# Patient Record
Sex: Female | Born: 1975 | Race: Black or African American | Hispanic: No | Marital: Married | Smoking: Never smoker
Health system: Southern US, Community
[De-identification: ages and names within clinical notes are randomized; demographics above are authoritative.]

## PROBLEM LIST (undated history)

## (undated) DIAGNOSIS — G5603 Carpal tunnel syndrome, bilateral upper limbs: Secondary | ICD-10-CM

## (undated) DIAGNOSIS — G500 Trigeminal neuralgia: Secondary | ICD-10-CM

## (undated) DIAGNOSIS — F419 Anxiety disorder, unspecified: Secondary | ICD-10-CM

## (undated) DIAGNOSIS — F32A Depression, unspecified: Secondary | ICD-10-CM

## (undated) DIAGNOSIS — E119 Type 2 diabetes mellitus without complications: Secondary | ICD-10-CM

## (undated) HISTORY — PX: TUBAL LIGATION: SHX77

## (undated) HISTORY — PX: WISDOM TOOTH EXTRACTION: SHX21

## (undated) HISTORY — DX: Trigeminal neuralgia: G50.0

---

## 2003-04-01 ENCOUNTER — Other Ambulatory Visit: Admission: RE | Admit: 2003-04-01 | Discharge: 2003-04-01 | Payer: Self-pay | Admitting: Obstetrics and Gynecology

## 2003-09-29 ENCOUNTER — Observation Stay (HOSPITAL_COMMUNITY): Admission: AD | Admit: 2003-09-29 | Discharge: 2003-09-30 | Payer: Self-pay | Admitting: Obstetrics and Gynecology

## 2003-10-24 ENCOUNTER — Inpatient Hospital Stay (HOSPITAL_COMMUNITY): Admission: AD | Admit: 2003-10-24 | Discharge: 2003-10-26 | Payer: Self-pay | Admitting: Obstetrics and Gynecology

## 2004-04-05 ENCOUNTER — Other Ambulatory Visit: Admission: RE | Admit: 2004-04-05 | Discharge: 2004-04-05 | Payer: Self-pay | Admitting: Obstetrics and Gynecology

## 2005-04-21 ENCOUNTER — Other Ambulatory Visit: Admission: RE | Admit: 2005-04-21 | Discharge: 2005-04-21 | Payer: Self-pay | Admitting: Obstetrics and Gynecology

## 2006-05-08 ENCOUNTER — Other Ambulatory Visit: Admission: RE | Admit: 2006-05-08 | Discharge: 2006-05-08 | Payer: Self-pay | Admitting: Obstetrics and Gynecology

## 2006-10-17 HISTORY — PX: WISDOM TOOTH EXTRACTION: SHX21

## 2006-10-17 HISTORY — PX: TUBAL LIGATION: SHX77

## 2007-08-18 ENCOUNTER — Inpatient Hospital Stay (HOSPITAL_COMMUNITY): Admission: AD | Admit: 2007-08-18 | Discharge: 2007-08-18 | Payer: Self-pay | Admitting: Obstetrics and Gynecology

## 2007-08-20 ENCOUNTER — Inpatient Hospital Stay (HOSPITAL_COMMUNITY): Admission: AD | Admit: 2007-08-20 | Discharge: 2007-08-22 | Payer: Self-pay | Admitting: Obstetrics and Gynecology

## 2007-09-27 ENCOUNTER — Ambulatory Visit (HOSPITAL_COMMUNITY): Admission: RE | Admit: 2007-09-27 | Discharge: 2007-09-27 | Payer: Self-pay | Admitting: Obstetrics and Gynecology

## 2007-10-18 DIAGNOSIS — E0590 Thyrotoxicosis, unspecified without thyrotoxic crisis or storm: Secondary | ICD-10-CM

## 2007-10-18 HISTORY — DX: Thyrotoxicosis, unspecified without thyrotoxic crisis or storm: E05.90

## 2008-05-21 ENCOUNTER — Encounter: Admission: RE | Admit: 2008-05-21 | Discharge: 2008-05-21 | Payer: Self-pay | Admitting: Endocrinology

## 2009-06-07 ENCOUNTER — Ambulatory Visit (HOSPITAL_COMMUNITY): Admission: RE | Admit: 2009-06-07 | Discharge: 2009-06-07 | Payer: Self-pay | Admitting: Emergency Medicine

## 2011-03-01 NOTE — Op Note (Signed)
NAMESHEMAIAH, ROUND   ACCOUNT NO.:  [id number]   MEDICAL RECORD NO.:  [id number]          PATIENT TYPE:  AMB   LOCATION:  SDC                           [facility]ILITY:  [facility]   PHYSICIAN:  Janine Limbo, M.D.DATE OF BIRTH:  1975-11-19   DATE OF PROCEDURE:  09/27/2007  DATE OF DISCHARGE:                               OPERATIVE REPORT   PREOPERATIVE DIAGNOSIS:  1. Fibroid uterus.  2. Obesity (weight 213 pounds).   POSTOPERATIVE DIAGNOSIS:  1. Fibroid uterus.  2. Obesity (weight 213 pounds).  3. Fibroid uterus.   PROCEDURE:  Laparoscopic tubal cautery.   SURGEON:  Dr. Leonard Schwartz.   FIRST ASSISTANT:  None.   ANESTHETIC:  General.   DISPOSITION:  Ms. Lynnette Caffey is a 35 year old female, para 2-0-0-2, who  presents with the above diagnosis.  She understands the indications for  her surgical procedure and she accepts the risks of, but not limited to,  anesthetic complications, bleeding, infection, possible damage to the  surrounding organs, and possible tubal failure.   FINDINGS:  The uterus was 12 weeks' size and it was irregular.  Multiple  subserosal fibroids were noted.  The fallopian tubes and the ovaries  were normal.  The bowel appeared normal.  The liver and the gallbladder  appeared normal.  The patient's preoperative hemoglobin was 12.7.  Her  hematocrit was 39.7%.  Her white blood cell count is 5300.  Her platelet  count was 276,000.  Urine pregnancy test was negative. Urinalysis was  negative.   DESCRIPTION OF PROCEDURE:  The patient was taken to the operating room  where a general anesthetic was given.  The patient's abdomen, perineum,  and vagina were prepped with multiple layers of Betadine.  The bladder  was drained of urine.  Examination under anesthesia was performed.  A  Hulka tenaculum was placed inside the uterus.  The patient was sterilely  draped.  The subumbilical area was injected with 5 mL of 0.5% Marcaine  with epinephrine.  An  incision was made in the subumbilical area and the  Veress needle was inserted into the abdominal cavity without difficulty.  Proper placement was confirmed using the saline drop test.  A  pneumoperitoneum was then obtained.  The laparoscopic trocar and then  the laparoscope were substituted for the Veress needle.  The pelvis was  visualized with findings as mentioned above.  The left fallopian tube  was identified and followed to its fimbriated end.  The proximal portion  of the left fallopian tube was cauterized in several areas using the  bipolar cautery. Hemostasis was adequate.  Care was taken not to damage  any of the surrounding organs.  An identical procedure was carried out  on the opposite side.  The pelvis was then carefully inspected. The  upper abdomen was inspected.  Pictures were taken. We felt we were ready  to end our procedure.  The pneumoperitoneum was allowed to escape.  A  final check was made for hemostasis and hemostasis was adequate.  Again  there was no evidence of damage to the bowel or other abdominal  structures.  The abdominal trocar was removed. The subumbilical fascia  was  closed using figure-of-eight sutures of #0 Vicryl.  The skin was  reapproximated using a subcuticular suture of 3-0 Monocryl.  The Hulka  tenaculum was removed.  The patient was noted to have brisk bleeding  from the cervix.  A speculum exam was performed and the patient was  noted to have brisk bleeding from the place where the Hulka tenaculum  attached to the cervix.  A figure-of-eight suture of #0 Vicryl was  placed and hemostasis was adequate.  The patient was then returned to  the supine position.  She was awakened from her anesthetic and taken to  the recovery room in stable condition.  She tolerated her procedure  well.   FOLLOW-UP INSTRUCTIONS:  The patient was given a prescription for Motrin  and she will take 800 mg every 8 hours as needed for mild to moderate  pain.  She will  take Percocet 5/325 1 or 2 tablets every 4 hours as  needed for severe pain.  She will return to see Dr. Stefano Gaul in 2-3  weeks for a follow-up examination.  She was given a copy of the  postoperative instruction sheet as prepared by the [facility] of  [facility] for patients who have undergone laparoscopy.      Janine Limbo, M.D.  Electronically Signed     AVS/MEDQ  D:  09/27/2007  T:  09/28/2007  Job:  [id number]

## 2011-03-01 NOTE — H&P (Signed)
[name], [name] NO.:  [id number]   MEDICAL RECORD NO.:  [id number]           PATIENT TYPE:   LOCATION:                                 FACILITY:   PHYSICIAN:  Janine Limbo, M.D.    DATE OF BIRTH:   DATE OF ADMISSION:  09/27/2007  DATE OF DISCHARGE:                              HISTORY & PHYSICAL   HISTORY OF PRESENT ILLNESS:  [name] is a 35 year old female,  para 2-0-0-2, who desires sterilization.  The patient has been followed  by the [facility] Gynecology Division of [facility].   OBSTETRICAL HISTORY:  1. The patient had a vaginal delivery in 2005 of a healthy female      infant.  2. She had a vaginal delivery on August 20, 2007, of a healthy female      infant.   DRUG ALLERGIES:  The patient is allergic to PENICILLIN.  She denies  Latex allergy and Betadine allergy.   PAST MEDICAL HISTORY:  The patient has a history of thalassemia trait.  She did have two teeth surgically removed in May 2008.   CURRENT MEDICATIONS:  Include vitamins.   SOCIAL HISTORY:  The patient denies cigarette use, alcohol use, and  recreational drug use.   REVIEW OF SYSTEMS:  Noncontributory.   FAMILY HISTORY:  Noncontributory.   PHYSICAL EXAMINATION:  VITAL SIGNS: Weight 213 pounds.  HEENT:  Within normal limits.  CHEST:  Clear.  HEART:  Regular rate and rhythm.  BREASTS:  Without masses.  ABDOMEN:  Nontender.  EXTREMITIES:  Grossly normal.  NEUROLOGIC:  Exam is grossly normal.  PELVIC:  External genitalia is normal.  Vagina is normal except for  relaxation.  Cervix is nontender.  Uterus is normal size, shape, and  consistency. Adnexa: No masses.   ASSESSMENT:  Desires permanent sterilization.   PLAN:  The patient will undergo a laparoscopic tubal cautery.  She  understands the indications for her surgical procedure, and she accepts  the risks of, but not limited to, anesthetic complications, bleeding,  infections,  possible damage to the surrounding organs, and possible  tubal failure (17 per 1000).      Janine Limbo, M.D.  Electronically Signed     AVS/MEDQ  D:  09/25/2007  T:  09/25/2007  Job:  [id number]
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
## 2011-03-04 NOTE — Discharge Summary (Signed)
NAME:  Kelly Charles, Kelly Charles             ACCOUNT NO.:  [id number]   MEDICAL RECORD NO.:  [id number]                   PATIENT TYPE:  OBV   LOCATION:  9165                                 [facility]ILITY:  [facility]   PHYSICIAN:  Osborn Coho, M.D.                DATE OF BIRTH:  February 10, 1976   DATE OF ADMISSION:  09/29/2003  DATE OF DISCHARGE:                                 DISCHARGE SUMMARY   ADMISSION DIAGNOSES:  1. Intrauterine pregnancy at 36 weeks.  2. Vaginal bleeding post exam.  3. Small mild variables on nonstress test at the office.   DISCHARGE DIAGNOSES:  1. Intrauterine pregnancy at 67 and one.  2. Resolved vaginal bleeding.  3. Reactive and reassuring fetal heart rate tracing.   PROCEDURES THIS ADMISSION:  None.   HOSPITAL COURSE:  Mrs. Hobdy is a 35 year old married black  female gravida 1 para 0 at 53 and zero-sevenths weeks who presented to  maternity admissions for evaluation secondary to having significant bright  red bleeding following collection of gonorrhea, chlamydia, and GBS cultures  at the office.  Because of her significant bleeding she was recommended to  have an NST at the office on which variables were present and she was then  sent over to maternity admissions for a CST and observation.  Her CST was  negative and her bleeding was minimal; however, the plan was made to observe  the patient overnight and to have an ultrasound for estimated fetal weight  and AFI on the morning of September 30, 2003.  She denies any regular uterine  contractions.  Her fetal heart rate is now reactive and reassuring without  variables noted, and the ultrasound is still pending, and cultures are also  pending.  She is deemed ready for discharge if the ultrasound is within  normal limits.   DISCHARGE INSTRUCTIONS:  Call for increased bleeding or signs/symptoms of  labor, decreased fetal movement.   DISCHARGE FOLLOW-UP:  As scheduled at [facility] or in  one week  if she is not currently scheduled.   DISCHARGE LABORATORY DATA:  Her cultures from yesterday are still pending.  Her wbc count on admission is 9.1, her hemoglobin is 9.9, and her platelets  are 260.   DISCHARGE MEDICATIONS:  Continue on prenatal vitamins daily.     Concha Pyo. Duplantis, C.N.M.              Osborn Coho, M.D.    SJD/MEDQ  D:  09/30/2003  T:  09/30/2003  Job:  [id number]

## 2011-03-04 NOTE — H&P (Signed)
NAME:  DARSHAY, DEUPREE             ACCOUNT NO.:  [id number]   MEDICAL RECORD NO.:  [id number]                   PATIENT TYPE:  OBV   LOCATION:  9165                                 [facility]  [facility]   PHYSICIAN:  Dr. Normand Sloop                         DATE OF BIRTH:  [birth year]   DATE OF ADMISSION:  09/29/2003  DATE OF DISCHARGE:                                HISTORY & PHYSICAL   [name] is a 35 year old married black female, primigravida, at 36-0/7  weeks who presents from the office after having cervical cultures collected,  followed by vaginal bleeding.  She then had a nonstress test in the office  that had fetal heart rate variables present. Her pregnancy has been followed  by [facility] OB/GYN MD service and has been unremarkable for: 1)  penicillin allergy; 2) history of thalassemia trait; 3) history of ulcer; 4)  obesity.  Her prenatal labs were collected on March 30, 2003, with hemoglobin  of 10.9, hematocrit 33.2, platelet count 345,000, blood type A positive,  antibody negative, sickle cell trait negative, RPR nonreactive, rubella  immune, hepatitis B surface antigen negative, HIV nonreactive, cystic  fibrosis testing negative. Her one hour Glucola on May 26, 2003, was 131.  Her repeat Glucola on August 18, 2003, was within normal limits and her  hemoglobin at that time was 10.4. Her group B strep, gonorrhea and chlamydia  cultures were collected today, September 29, 2003, and they are pending.   HISTORY OF PRESENT PREGNANCY:  She presented for care at [facility] on  April 01, 2003, at approximately 10 week's gestation. Pregnancy  ultrasonography at 18 week's gestation showed growth consistent with  previous dating, placenta was anterior. She was treated for bacterial  vaginosis at 18 week's gestation. The rest of her prenatal care was  unremarkable.   OBSTETRIC HISTORY:  She is a primigravida.   MEDICAL HISTORY:  She has a medication allergy to  PENICILLIN resulting in a  rash and throat swelling. She has used oral contraceptives in the past, but  she stopped in December 2003. She was treated for Trichomonads in 2003. She  has had a yeast infection times one. She has an Art therapist. She reports  having had the usual childhood illnesses. She was diagnosed with thalassemia  in 2004 and she takes iron for that.  She was diagnosed with peptic ulcer in  2004.   SURGICAL HISTORY:  Negative.   FAMILY MEDICAL HISTORY:  Multiple family members have history of MI as well  as hypertension.  Also multiple family members with diabetes, maternal aunt  with thyroid dysfunction. Maternal grandmother received dialysis and also  had a stroke. Mother has a history of migraines. Father had history of  prostate cancer. Maternal grandfather with bladder cancer. Maternal aunt  with depression, anxiety, and schizophrenia.   GENETIC HISTORY:  Remarkable for a patient's cousin with autism and patient  has also has  two cousins with sickle cell trait.   SOCIAL HISTORY:  The patient is married to the father of the baby.  He is  involved and supportive. His name is Samule Dry. He is educated through the 11th  and is self-employed. The patient is college educated and is employed full  time. They deny any alcohol, tobacco, or illicit drug use with the  pregnancy.   OBJECTIVE:  VITAL SIGNS: Her vital signs are stable and she is afebrile.  HEENT: Grossly within normal limits.  CHEST: Clear to auscultation.  HEART: Regular rate and rhythm.  ABDOMEN: Gravid in contour with fundal height staying approximately 36 cm  above the pubic symphysis. Fetal heart rate is reactive and reassuring. She  has a negative OCP.  She had one variable down the 120s times 5 seconds with  a quick recovery to the baseline of 140s.  Uterine contractions every two to  four minutes with a Pitocin at 2 milliunits per minute during her OCT.  Occasional uterine contractions without present.  Sterile speculum exam shows  white discharge present with small pink tinge. No active bleeding.  EXTREMITIES: Within normal limits.   ASSESSMENT:  1. Intrauterine pregnancy at 36 weeks.  2. Previous fetal heart rate variables.  3. Status post vaginal bleeding.   PLAN:  Admit for 23-hour observation for consult with Dr. Normand Sloop to observe  fetal heart rate as well as vaginal bleeding.     Cam Hai, C.N.M.                     Dr. Normand Sloop    KS/MEDQ  D:  09/29/2003  T:  09/29/2003  Job:  [id number]

## 2011-03-04 NOTE — H&P (Signed)
NAME:  Kelly Charles, Kelly Charles             ACCOUNT NO.:  [id number]   MEDICAL RECORD NO.:  [id number]                   PATIENT TYPE:  INP   LOCATION:  9174                                 [facility]ILITY:  [facility]   PHYSICIAN:  Janine Limbo, M.D.            DATE OF BIRTH:  May 15, 1976   DATE OF ADMISSION:  10/24/2003  DATE OF DISCHARGE:                                HISTORY & PHYSICAL   HISTORY OF PRESENT ILLNESS:  Kelly Charles is a 35 year old gravida  1, para 0 at 39-4/7 weeks, who presented from the office with uterine  contractions every five minutes, and cervix 4-5 cm, 90% vertex at a 0  station with a bulging bag of water.  The patient is requesting an epidural  at present.  She reports positive fetal movement.  Denies leaking or  bleeding, headache, visual symptoms or epigastric pain.   PRESENT PREGNANCY RISKS:  Pregnancy has been remarkable for:  1. Positive group B strep, with allergy to penicillin and with group B     sensitivity to clindamycin documented.  2. Thalassemia trait, with father of the baby negative.  3. History of ulcer.   PRENATAL LABS:  Blood type A positive.  Rh antibody negative.  VDRL  nonreactive.  Rubella titer positive.  Hepatitis B surface antigen negative.  HIV nonreactive.  GC and chlamydia cultures negative.  Pap normal.  Glucose  challenge normal.  AFP quadruple screen declined.  Hemoglobin  electrophoresis negative.  Hemoglobin upon entry into practice 10.9, it was  10.4 at 30 weeks.  Group B strep culture was positive at 36 weeks.  Other  cultures were negative.  Cystic fibrosis testing was also negative.   HISTORY OF PRESENT PREGNANCY:  [facility] October 27, 2003 was established by last  menstrual period, and was in agreement with ultrasound at approximately 18  weeks.  The patient entered care at approximately 10 weeks.  She had  toxoplasmosis  titers done which were also negative, secondary to history of  indoor cat.  Husband was  referred to [facility] for evaluation of  thalassemia trait, this was negative.  The patient was started on iron at 18  weeks, secondary to hemoglobin of 10.0.  She had an early Glucola secondary  to weight above 200.  Quadruple screen was delayed.  Her follow-up Glucola  was normal.  She had negative cultures done at 36 weeks, but a positive  Group B strep was noted.  Due to her penicillin allergies, sensitivities  were done;  it was sensitive to clindamycin.  The rest of her pregnancy was  essentially uncomplicated.   OBSTETRICAL HISTORY:  The patient is a primigravida.   PAST MEDICAL HISTORY:  She was on oral contraceptives until December 2003.  She had trichomoniasis diagnosed in 2003.  She had a yeast infection in the  past.  She has an indoor cat.  She reports the usual childhood illnesses.  She was noted to be anemic in 2004, and was already  on iron.  She had peptic  ulcer diagnosed in 2004.  She had a history of thalassemia trait, but  negative hemoglobin electrophoresis this pregnancy.   ALLERGIES:  PENICILLIN (which causes rash and throat tightening).   FAMILY HISTORY:  Maternal grandfather had an MI.  Maternal grandmother had  an MI x2, and a maternal uncle had an MI.  Maternal aunt, maternal  grandmother and paternal grandmother have hypertension.  Mother, maternal  aunt, father and paternal grandmother all have diabetes.  Maternal aunt and  maternal uncle have noninsulin-dependent diabetes.  Paternal aunt has  thyroid issues.  Maternal grandmother had dialysis.  Maternal grandmother  had a stroke.  Mother had migraines.  Father is deceased from prostate  cancer.  Maternal grandfather had bladder cancer.  There is a history of  maternal aunts with depression, anxiety and schizophrenia.  The patient's  mother, maternal grandfather, maternal grandmother and maternal aunts all  smoke.   GENETIC HISTORY:  Remarkable for the patient's cousin having autism.  The   patient's cousin has sickle cell trait.   SOCIAL HISTORY:  The patient is married to the father of the baby; he is  involved and supportive Kelly Charles).  The patient is college educated.  She is employed as a Production designer, theatre/television/film.  Her husband has an 11th grade education and  he is self-employed.  The patient has been followed by the physician's  service at [facility].  She denies any alcohol, drug or tobacco  use during this pregnancy.  She is African-American in ethnicity.   PHYSICAL EXAMINATION:  VITAL SIGNS:  Stable.  The patient is afebrile.  HEENT:  Within normal limits.  LUNGS:  Bilateral breath sounds are clear.  HEART:  Regular rate and rhythm; without murmurs.  BREASTS:  Soft and nontender.  ABDOMEN:  Fundal height is approximately 39 cm.  Estimated fetal weight 7-  7.5 pounds.  Uterine contractions every five minutes with irritability  between and of moderate quality.  Fetal heart rate is reactive, with  occasional mild variables noted.  CERVICAL:  (Per Dr. Estanislado Pandy in the office)  4-5 cm, 90%, vertex at a 0  station; with bulging bag of waters.  EXTREMITIES:  Deep tendon reflexes are 2+ without clonus.  There is a trace  of edema noted.   IMPRESSION:  1. Intrauterine pregnancy at 39-4/7 weeks.  2. Active labor.  3. Positive group B strep, with sensitivity to clindamycin.  4. Penicillin allergy.   PLAN:  1. Admit to birthing suite for consult with Dr. Janine Limbo as     attending physician.  2. Routine physician orders.  3. Plan group B strep prophylaxis with clindamycin, secondary to penicillin     allergy.  4. Patient desires epidural.     Chip Boer L. Emilee Hero, C.N.M.                   Janine Limbo, M.D.    VLL/MEDQ  D:  10/24/2003  T:  10/24/2003  Job:  [id number]

## 2011-07-25 LAB — URINALYSIS, DIPSTICK ONLY
Bilirubin Urine: NEGATIVE
Glucose, UA: NEGATIVE
Hgb urine dipstick: NEGATIVE
Ketones, ur: NEGATIVE
Leukocytes, UA: NEGATIVE
Nitrite: NEGATIVE
Protein, ur: NEGATIVE
Specific Gravity, Urine: 1.015
Urobilinogen, UA: 0.2
pH: 6

## 2011-07-25 LAB — CBC
HCT: 39.7
Hemoglobin: 12.7
MCHC: 31.9
MCV: 73.9 — ABNORMAL LOW
Platelets: 276
RBC: 5.37 — ABNORMAL HIGH
RDW: 14
WBC: 5.3

## 2011-07-25 LAB — PREGNANCY, URINE: Preg Test, Ur: NEGATIVE

## 2011-07-26 LAB — CBC
HCT: 35.4 — ABNORMAL LOW
HCT: 37.8
Hemoglobin: 11.2 — ABNORMAL LOW
Hemoglobin: 12
MCHC: 31.5
MCHC: 31.6
MCV: 75.1 — ABNORMAL LOW
MCV: 75.8 — ABNORMAL LOW
Platelets: 269
Platelets: 270
RBC: 4.71
RBC: 4.99
RDW: 15.1 — ABNORMAL HIGH
RDW: 15.2 — ABNORMAL HIGH
WBC: 10.7 — ABNORMAL HIGH
WBC: 9.7

## 2011-07-26 LAB — RPR: RPR Ser Ql: NONREACTIVE

## 2011-07-26 LAB — KLEIHAUER-BETKE STAIN
Fetal Cells %: 0
Quantitation Fetal Hemoglobin: 0

## 2012-02-24 ENCOUNTER — Ambulatory Visit (INDEPENDENT_AMBULATORY_CARE_PROVIDER_SITE_OTHER): Payer: 59 | Admitting: Obstetrics and Gynecology

## 2012-02-24 ENCOUNTER — Encounter: Payer: Self-pay | Admitting: Obstetrics and Gynecology

## 2012-02-24 VITALS — BP 118/70 | Temp 98.1°F | Ht 66.0 in | Wt 223.0 lb

## 2012-02-24 DIAGNOSIS — B9689 Other specified bacterial agents as the cause of diseases classified elsewhere: Secondary | ICD-10-CM

## 2012-02-24 DIAGNOSIS — G500 Trigeminal neuralgia: Secondary | ICD-10-CM

## 2012-02-24 DIAGNOSIS — Z01419 Encounter for gynecological examination (general) (routine) without abnormal findings: Secondary | ICD-10-CM

## 2012-02-24 DIAGNOSIS — Z124 Encounter for screening for malignant neoplasm of cervix: Secondary | ICD-10-CM

## 2012-02-24 DIAGNOSIS — A499 Bacterial infection, unspecified: Secondary | ICD-10-CM

## 2012-02-24 DIAGNOSIS — N760 Acute vaginitis: Secondary | ICD-10-CM

## 2012-02-24 MED ORDER — METRONIDAZOLE 500 MG PO TABS: 500.0000 mg | ORAL_TABLET | Freq: Three times a day (TID) | ORAL | Status: AC

## 2012-02-24 MED ORDER — FLUCONAZOLE 150 MG PO TABS: 150.0000 mg | ORAL_TABLET | Freq: Once | ORAL | Status: AC

## 2012-02-24 NOTE — Progress Notes (Signed)
Subjective:    Kelly Charles is a 36 y.o. female, G2P2002, who presents for an annual exam. Patient is new to the practice and complains of malodorous vaginal discharge for the past 2 weeks.    History   Social History  . Marital Status: Married    Spouse Name: N/A    Number of Children: N/A  . Years of Education: N/A   Social History Main Topics  . Smoking status: Never Smoker   . Smokeless tobacco: Never Used  . Alcohol Use: No  . Drug Use: No  . Sexually Active: Yes -- Female partner(s)    Birth Control/ Protection: Other-see comments     BTL   Other Topics Concern  . None   Social History Narrative  . None    Menstrual cycle:   LMP: Patient's last menstrual period was 02/12/2012.  Flow medium with minimal cramps per patient           Review of Systems Pertinent items are noted in HPI. Breast:Negative for breast lump,nipple discharge or nipple retraction Gastrointestinal: Negative for abdominal pain, change in bowel habits or rectal bleeding Genitourinary: + vaginitis sx but no urgency, frequency or incontinence   Objective:    BP 118/70  Temp(Src) 98.1 F (36.7 C) (Oral)  Ht 5\' 6"  (1.676 m)  Wt 223 lb (101.152 kg)  BMI 35.99 kg/m2  LMP 02/12/2012   Wt Readings from Last 1 Encounters:  02/24/12 223 lb (101.152 kg)   Body mass index is 35.99 kg/(m^2).  General Appearance: Alert, appropriate appearance for age. No acute distress HEENT: Grossly normal Neck / Thyroid: Supple, no masses, nodes or enlargement Lungs: clear to auscultation bilaterally Back: No CVA tenderness Breast Exam:No masses or nodes.No dimpling, nipple retraction or discharge Cardiovascular: Regular rate and rhythm. S1, S2, no murmur Gastrointestinal: Soft, non-tender, no masses or organomegaly Pelvic Exam: EGBUS-wnl, vagina-moderate malodorous discharge, cervix-no lesions or tenderness, uterus-ULNS without tenderness, adnexae-no masses/tenderness Lymphatic Exam: Non-palpable  nodes in neck, clavicular, axillary, or inguinal regions  Skin: no rash or abnormalities Neurologic: Normal gait and speech, no tremor  Psychiatric: Alert and oriented, appropriate affect.   Wet Prep: pH 5.5, whiff +, clue +   Assessment:   Routine GYN Exam Bacterial Vaginosis   Plan:   Diflucan 150 mg #1 1 po stat 1 refill-yeast prophalaxis  Metronidazole 500 mg #14 bid x 7 days no refills; Etoh pre  cautions  PAP-done  RTO-1 year       Anyelina Claycomb PA-C

## 2012-02-24 NOTE — Patient Instructions (Signed)
Avoid: - excess soap on genital area (consider using plain oatmeal soap) - use of powder or sprays in genital area - douching - wearing underwear to bed (except with menses) - using more than is directed detergent when washing clothes - tight fitting garments around genital area - excess sugar intake   

## 2012-02-24 NOTE — Progress Notes (Signed)
Regular Periods: yes Mammogram: no  Monthly Breast Ex.: yes Exercise: no  Tetanus < 10 years: yes Seatbelts: yes  NI. Bladder Functn.: yes Abuse at home: no  Daily BM's: yes Stressful Work: yes  Healthy Diet: yes Sigmoid-Colonoscopy: NO  Calcium: no Medical problems this year: C/O VAGINAL DISCHARGE    LAST PAP:2011 NL  Contraception: BTL  Mammogram:  NEVER  PCP: DR. [name]  PMH: NO CHANGE  FMH: NOCHANGE  Last Bone Scan: NEVER

## 2012-02-28 LAB — PAP IG W/ RFLX HPV ASCU

## 2013-01-29 ENCOUNTER — Telehealth: Payer: Self-pay | Admitting: *Deleted

## 2013-01-29 NOTE — Telephone Encounter (Signed)
Patient calling to make a follow up appointment. Patient missed appointment this past November.

## 2013-04-17 ENCOUNTER — Ambulatory Visit (INDEPENDENT_AMBULATORY_CARE_PROVIDER_SITE_OTHER): Payer: 59 | Admitting: Nurse Practitioner

## 2013-04-17 ENCOUNTER — Encounter: Payer: Self-pay | Admitting: Nurse Practitioner

## 2013-04-17 VITALS — BP 110/64 | HR 78 | Ht 66.0 in | Wt 192.0 lb

## 2013-04-17 DIAGNOSIS — G500 Trigeminal neuralgia: Secondary | ICD-10-CM

## 2013-04-17 MED ORDER — CARBAMAZEPINE ER 300 MG PO CP12: 300.0000 mg | ORAL_CAPSULE | Freq: Two times a day (BID) | ORAL | Status: DC

## 2013-04-17 NOTE — Progress Notes (Signed)
HPI: Patient returns for followup after last visit 09/13/2011. She is a 37 year old black female who developed facial pain in late July 2010,  involving  the right side of the face, sometimes the upper jaw and sometimes the lower jaw. When it began, she noticed discomfort with talking and with biting/chewing  food on the right.   She went to Urgent care and was given a 12 day Prednisone dose pack. This did not help much. She describes the pain as consistent and throbbing, it does not let up. She has taken Neurontin 600 mg 6 to 7  times daily  with out improvement.   She typically has episodes once a month. She denies any other focal neurologic symptoms of numbness, weakness, paresthesia, visual symptoms, dysarthria, dysphagia, bowel or bladder symptoms. MRI of the brain was normal in the past.Carbitrol was added in January 2012 and she has had good control of neuralgia pain.  TODAY: Facial pain well controlled with Carbitrol.  She has discontinued Gabapentin. She  has not had a flare of facial  pain since last seen. She is pleased with how well she is doing. No new neurological complaints. See ROS     ROS:  negative  Physical Exam General: well developed, obese female , seated, in no evident distress Head: head normocephalic and atraumatic. Oropharynx benign Neck: supple with no carotid  bruits  Neurologic Exam Mental Status: Awake and fully alert. Oriented to place and time. Follows all commands. Speech and language normal.   Cranial Nerves:  Pupils equal, briskly reactive to light. Extraocular movements full without nystagmus. Visual fields full to confrontation. Hearing intact and symmetric to finger snap. Facial sensation intact. Face, tongue, palate move normally and symmetrically. Neck flexion and extension normal.  Motor: Normal bulk and tone. Normal strength in all tested extremity muscles.No focal weakness Sensory.: intact to touch and pinprick and vibratory.  Coordination: Rapid  alternating movements normal in all extremities. Finger-to-nose and heel-to-shin performed accurately bilaterally. Gait and Station: Arises from chair without difficulty. Stance is normal. Gait demonstrates normal stride length and balance . Able to heel, toe and tandem walk without difficulty.  Reflexes: 2+ and symmetric. Toes downgoing.     ASSESSMENT: Trigeminal neuralgia well controlled on generic Carbatrol     PLAN: Continue gen Carbitrol twice a day for facial pain Given refill for next year Followup in one year and when necessary Nilda Riggs, GNP-BC APRN

## 2013-04-17 NOTE — Patient Instructions (Addendum)
Continue carbamazepine twice a day for facial pain Given refill for next year Followup in one year and when necessary

## 2013-04-17 NOTE — Progress Notes (Signed)
I have read the note, and I agree with the clinical assessment and plan.  

## 2013-08-22 ENCOUNTER — Other Ambulatory Visit: Payer: Self-pay

## 2013-12-12 ENCOUNTER — Encounter (HOSPITAL_COMMUNITY): Payer: Self-pay | Admitting: Emergency Medicine

## 2013-12-12 ENCOUNTER — Emergency Department (HOSPITAL_COMMUNITY)
Admission: EM | Admit: 2013-12-12 | Discharge: 2013-12-12 | Disposition: A | Discharge status: Home / Self Care | Payer: 59 | Source: Home / Self Care | Attending: Emergency Medicine | Admitting: Emergency Medicine

## 2013-12-12 DIAGNOSIS — H109 Unspecified conjunctivitis: Secondary | ICD-10-CM

## 2013-12-12 HISTORY — DX: Type 2 diabetes mellitus without complications: E11.9

## 2013-12-12 MED ORDER — TOBRAMYCIN 0.3 % OP SOLN: 2.0000 [drp] | OPHTHALMIC | Status: DC

## 2013-12-12 NOTE — ED Notes (Signed)
Reports general aches, eyes red and swollen, eyes watery, onset last night, does not wear contacts

## 2013-12-12 NOTE — ED Provider Notes (Signed)
CSN: [id number]     Arrival date & time 12/12/13  1006 History   First MD Initiated Contact with Patient 12/12/13 1042     Chief Complaint  Patient presents with  . Generalized Body Aches  . Eye Pain     (Consider location/radiation/quality/duration/timing/severity/associated sxs/prior Treatment) HPI Comments: Began with right eye redness, discomfort, upper lid swelling and yellow discharge yesterday. Woke overnight to find that left eye had become involved with same. This morning has generalized myalgias and malaise. No vision changes. No URI sx. No contact lens use. No foreign body sensation.   Patient is a 38 y.o. female presenting with eye pain. The history is provided by the patient.  Eye Pain    Past Medical History  Diagnosis Date  . Trigeminal neuralgia   . Trigeminal neuralgia   . Diabetes mellitus without complication    Past Surgical History  Procedure Laterality Date  . Tubal ligation    . Wisdom tooth extraction     Family History  Problem Relation Age of Onset  . Cancer Maternal Grandfather     BLADDER  . Cancer Father     PROSTATE  . Diabetes Father   . Diabetes Mother   . Mental illness Mother     ANXIETY  . Aneurysm Mother     Small cerebral aneurysm  . Thyroid disease Maternal Aunt   . Cancer Maternal Aunt     LUNG  . Hypertension Maternal Uncle   . Diabetes Maternal Uncle   . Hypertension Maternal Aunt   . Diabetes Maternal Aunt   . Mental illness Maternal Aunt    History  Substance Use Topics  . Smoking status: Never Smoker   . Smokeless tobacco: Never Used  . Alcohol Use: No   OB History   Grav Para Term Preterm Abortions TAB SAB Ect Mult Living   2 2 2       2      Review of Systems  Eyes: Positive for pain.  All other systems reviewed and are negative.      Allergies  Penicillins  Home Medications   Current Outpatient Rx  Name  Route  Sig  Dispense  Refill  . carbamazepine (CARBATROL) 300 MG 12 hr capsule   Oral  Take 1 capsule (300 mg total) by mouth 2 (two) times daily.   180 capsule   3   . metFORMIN (GLUCOPHAGE-XR) 500 MG 24 hr tablet   Oral   Take 500 mg by mouth daily.         Marland Kitchen tobramycin (TOBREX) 0.3 % ophthalmic solution   Both Eyes   Place 2 drops into both eyes every 4 (four) hours. X 7 days   5 mL   0    BP 129/84  Pulse 94  Temp(Src) 100.2 F (37.9 C) (Oral)  Resp 16  SpO2 98%  LMP 12/11/2013 Physical Exam  Nursing note and vitals reviewed. Constitutional: She is oriented to person, place, and time. She appears well-developed and well-nourished. No distress.  HENT:  Head: Normocephalic and atraumatic.  Right Ear: External ear normal.  Left Ear: External ear normal.  Nose: Nose normal.  Eyes: EOM are normal. Pupils are equal, round, and reactive to light. Right eye exhibits discharge. Left eye exhibits discharge. Right conjunctiva is injected. Left conjunctiva is injected. No scleral icterus.  Bilateral upper eye lid edema (R>L) with mild erythema. Lashes matted with yellow crusted material. No photophobia or pain with EOMs  Neck: Normal range of  motion. Neck supple.  Cardiovascular: Normal rate, regular rhythm and normal heart sounds.   Pulmonary/Chest: Effort normal and breath sounds normal.  Musculoskeletal: Normal range of motion.  Lymphadenopathy:    She has no cervical adenopathy.  Neurological: She is alert and oriented to person, place, and time.  Skin: Skin is warm and dry.  Psychiatric: She has a normal mood and affect. Her behavior is normal.    ED Course  Procedures (including critical care time) Labs Review Labs Reviewed - No data to display Imaging Review No results found.    MDM   Final diagnoses:  Conjunctivitis  Bilateral Conjunctivitis: Will initiate treatment with Tobrex and advise close follow up if symptoms do not begin to improve over next 36-48 hours.     Jess BartersJennifer Lee [place], [place] 12/12/13 1101

## 2013-12-12 NOTE — Discharge Instructions (Signed)
Bacterial Conjunctivitis  Bacterial conjunctivitis, commonly called pink eye, is an inflammation of the clear membrane that covers the white part of the eye (conjunctiva). The inflammation can also happen on the underside of the eyelids. The blood vessels in the conjunctiva become inflamed causing the eye to become red or pink. Bacterial conjunctivitis may spread easily from one eye to another and from person to person (contagious).   CAUSES   Bacterial conjunctivitis is caused by bacteria. The bacteria may come from your own skin, your upper respiratory tract, or from someone else with bacterial conjunctivitis.  SYMPTOMS   The normally white color of the eye or the underside of the eyelid is usually pink or red. The pink eye is usually associated with irritation, tearing, and some sensitivity to light. Bacterial conjunctivitis is often associated with a thick, yellowish discharge from the eye. The discharge may turn into a crust on the eyelids overnight, which causes your eyelids to stick together. If a discharge is present, there may also be some blurred vision in the affected eye.  DIAGNOSIS   Bacterial conjunctivitis is diagnosed by your caregiver through an eye exam and the symptoms that you report. Your caregiver looks for changes in the surface tissues of your eyes, which may point to the specific type of conjunctivitis. A sample of any discharge may be collected on a cotton-tip swab if you have a severe case of conjunctivitis, if your cornea is affected, or if you keep getting repeat infections that do not respond to treatment. The sample will be sent to a lab to see if the inflammation is caused by a bacterial infection and to see if the infection will respond to antibiotic medicines.  TREATMENT   · Bacterial conjunctivitis is treated with antibiotics. Antibiotic eyedrops are most often used. However, antibiotic ointments are also available. Antibiotics pills are sometimes used. Artificial tears or eye  washes may ease discomfort.  HOME CARE INSTRUCTIONS   · To ease discomfort, apply a cool, clean wash cloth to your eye for 10 20 minutes, 3 4 times a day.  · Gently wipe away any drainage from your eye with a warm, wet washcloth or a cotton ball.  · Wash your hands often with soap and water. Use paper towels to dry your hands.  · Do not share towels or wash cloths. This may spread the infection.  · Change or wash your pillow case every day.  · You should not use eye makeup until the infection is gone.  · Do not operate machinery or drive if your vision is blurred.  · Stop using contacts lenses. Ask your caregiver how to sterilize or replace your contacts before using them again. This depends on the type of contact lenses that you use.  · When applying medicine to the infected eye, do not touch the edge of your eyelid with the eyedrop bottle or ointment tube.  SEEK IMMEDIATE MEDICAL CARE IF:   · Your infection has not improved within 3 days after beginning treatment.  · You had yellow discharge from your eye and it returns.  · You have increased eye pain.  · Your eye redness is spreading.  · Your vision becomes blurred.  · You have a fever or persistent symptoms for more than 2 3 days.  · You have a fever and your symptoms suddenly get worse.  · You have facial pain, redness, or swelling.  MAKE SURE YOU:   · Understand these instructions.  · Will watch your   condition.  · Will get help right away if you are not doing well or get worse.  Document Released: 10/03/2005 Document Revised: 06/27/2012 Document Reviewed: 03/05/2012  Exit[employer]® Patient Information ©2014 [employer].

## 2013-12-13 NOTE — ED Provider Notes (Signed)
Medical screening examination/treatment/procedure(s) were performed by non-physician practitioner and as supervising physician I was immediately available for consultation/collaboration.  Leslee Homeavid Cassia Fein, M.D.  Reuben Likesavid C Anaiah Mcmannis, MD 12/13/13 [phone]

## 2014-02-25 ENCOUNTER — Encounter: Payer: Self-pay | Admitting: Neurology

## 2014-04-17 ENCOUNTER — Ambulatory Visit: Payer: 59 | Admitting: Neurology

## 2014-06-10 ENCOUNTER — Other Ambulatory Visit: Payer: Self-pay

## 2014-06-10 MED ORDER — CARBAMAZEPINE ER 300 MG PO CP12: 300.0000 mg | ORAL_CAPSULE | Freq: Two times a day (BID) | ORAL | Status: DC

## 2014-06-10 NOTE — Telephone Encounter (Signed)
Patient has to reschedule appt due to provider schedule.

## 2014-08-18 ENCOUNTER — Encounter (HOSPITAL_COMMUNITY): Payer: Self-pay | Admitting: Emergency Medicine

## 2014-09-18 ENCOUNTER — Ambulatory Visit (INDEPENDENT_AMBULATORY_CARE_PROVIDER_SITE_OTHER): Payer: Medicaid Other | Admitting: Nurse Practitioner

## 2014-09-18 ENCOUNTER — Encounter: Payer: Self-pay | Admitting: Nurse Practitioner

## 2014-09-18 VITALS — BP 110/72 | HR 90 | Temp 98.3°F | Ht 66.0 in | Wt 191.0 lb

## 2014-09-18 DIAGNOSIS — G500 Trigeminal neuralgia: Secondary | ICD-10-CM

## 2014-09-18 MED ORDER — CARBAMAZEPINE ER 300 MG PO CP12: 300.0000 mg | ORAL_CAPSULE | Freq: Two times a day (BID) | ORAL | Status: DC

## 2014-09-18 NOTE — Progress Notes (Signed)
I have read the note, and I agree with the clinical assessment and plan.  Melia Hopes KEITH   

## 2014-09-18 NOTE — Patient Instructions (Signed)
Continue generic Carbatrol twice daily for facial pain will refill Labs: By primary care Dr. Renae GlossShelton Follow-up yearly and when necessary

## 2014-09-18 NOTE — Progress Notes (Signed)
[facility]  PATIENT: Kelly Charles DOB: 07/09/1976   REASON FOR VISIT: follow-up her trigeminal neuralgia    HISTORY OF PRESENT ILLNESS:Kelly Charles, 38 year old female returns for follow-up. She was last seen 04/17/2013. She has a history of trigeminal neuralgia which has  responded well to Carbatrol twice a day since this was begun in January 2012.She developed facial pain in late July 2010, involving the right side of the face, sometimes the upper jaw and sometimes the lower jaw. When it began, she noticed discomfort with talking and with biting/chewing food on the right.  She has failed Neurontin 600 in the past. She  denies any other focal neurologic symptoms of numbness, weakness, paresthesia, visual symptoms, dysarthria, dysphagia, bowel or bladder symptoms. MRI of the brain was normal in the past. She has not had a flare of facial pain since last seen. She returns for reevaluation and refills. REVIEW OF SYSTEMS: Full 14 system review of systems performed and notable only for those listed, all others are neg:  Constitutional: N/A  Cardiovascular: N/A  Ear/Nose/Throat: N/A  Skin: N/A  Eyes: N/A  Respiratory: N/A  Gastroitestinal: N/A  Hematology/Lymphatic: N/A  Endocrine: N/A Musculoskeletal:N/A  Allergy/Immunology: N/A  Neurological: N/A Psychiatric: N/A Sleep : NA   ALLERGIES: Allergies  Allergen Reactions  . Penicillins Rash    HOME MEDICATIONS: Outpatient Prescriptions Prior to Visit  Medication Sig Dispense Refill  . carbamazepine (CARBATROL) 300 MG 12 hr capsule Take 1 capsule (300 mg total) by mouth 2 (two) times daily. 180 capsule 0  . metFORMIN (GLUCOPHAGE-XR) 500 MG 24 hr tablet Take 500 mg by mouth daily.    Marland Kitchen. tobramycin (TOBREX) 0.3 % ophthalmic solution Place 2 drops into both eyes every 4 (four) hours. X 7 days (Patient not taking: Reported on 09/18/2014) 5 mL 0   No facility-administered medications prior to  visit.    PAST MEDICAL HISTORY: Past Medical History  Diagnosis Date  . Trigeminal neuralgia   . Trigeminal neuralgia   . Diabetes mellitus without complication     PAST SURGICAL HISTORY: Past Surgical History  Procedure Laterality Date  . Tubal ligation    . Wisdom tooth extraction      FAMILY HISTORY: Family History  Problem Relation Age of Onset  . Cancer Maternal Grandfather     BLADDER  . Cancer Father     PROSTATE  . Diabetes Father   . Diabetes Mother   . Mental illness Mother     ANXIETY  . Aneurysm Mother     Small cerebral aneurysm  . Thyroid disease Maternal Aunt   . Cancer Maternal Aunt     LUNG  . Hypertension Maternal Uncle   . Diabetes Maternal Uncle   . Hypertension Maternal Aunt   . Diabetes Maternal Aunt   . Mental illness Maternal Aunt     SOCIAL HISTORY: History   Social History  . Marital Status: Married    Spouse Name: N/A    Number of Children: 2  . Years of Education: 12   Occupational History  .  [employer]   Social History Main Topics  . Smoking status: Never Smoker   . Smokeless tobacco: Never Used  . Alcohol Use: No  . Drug Use: No  . Sexual Activity:    Partners: Male    Birth Control/ Protection: Other-see comments     Comment: BTL   Other Topics Concern  . Not on file   Social History Narrative  PHYSICAL EXAM  Filed Vitals:   09/18/14 1530  BP: 110/72  Pulse: 90  Temp: 98.3 F (36.8 C)  TempSrc: Oral  Height: 5\' 6"  (1.676 m)  Weight: 191 lb (86.637 kg)   Body mass index is 30.84 kg/(m^2). General: well developed, obese female , seated, in no evident distress Head: head normocephalic and atraumatic. Oropharynx benign Neck: supple with no carotid bruits  Neurologic Exam Mental Status: Awake and fully alert. Oriented to place and time. Follows all commands. Speech and language normal.  Cranial Nerves: Pupils equal, briskly reactive to light. Extraocular movements full without  nystagmus. Visual fields full to confrontation. Hearing intact and symmetric to finger snap. Facial sensation intact. Face, tongue, palate move normally and symmetrically. Neck flexion and extension normal.  Motor: Normal bulk and tone. Normal strength in all tested extremity muscles.No focal weakness Sensory.: intact to touch and pinprick and vibratory.  Coordination: Rapid alternating movements normal in all extremities. Finger-to-nose and heel-to-shin performed accurately bilaterally. Gait and Station: Arises from chair without difficulty. Stance is normal. Gait demonstrates normal stride length and balance . Able to heel, toe and tandem walk without difficulty.  Reflexes: 2+ and symmetric. Toes downgoing.    DIAGNOSTIC DATA (LABS, IMAGING, TESTING)  ASSESSMENT AND PLAN  38 y.o. year old female  has a past medical history of Trigeminal neuralgia;  here to follow up. Her facial pain is well controlled with Carbatrol twice daily.  Continue generic Carbatrol twice daily for facial pain will refill Labs: By primary care Dr. Renae GlossShelton Follow-up yearly and when necessary Nilda RiggsNancy Carolyn Quaneisha Hanisch, [facility] Behavioral Health SystemGNP, [facility] And Medical CenterBC, APRN  [facility] [address], [address] [place], [place] [address] [phone]

## 2014-10-17 ENCOUNTER — Encounter (HOSPITAL_COMMUNITY): Payer: Self-pay | Admitting: Emergency Medicine

## 2014-10-17 ENCOUNTER — Emergency Department (INDEPENDENT_AMBULATORY_CARE_PROVIDER_SITE_OTHER)
Admission: EM | Admit: 2014-10-17 | Discharge: 2014-10-17 | Disposition: A | Discharge status: Home / Self Care | Payer: Medicaid Other | Source: Home / Self Care | Attending: Emergency Medicine | Admitting: Emergency Medicine

## 2014-10-17 DIAGNOSIS — R519 Headache, unspecified: Secondary | ICD-10-CM

## 2014-10-17 DIAGNOSIS — R51 Headache: Secondary | ICD-10-CM | POA: Diagnosis not present

## 2014-10-17 MED ORDER — KETOROLAC TROMETHAMINE 60 MG/2ML IM SOLN
60.0000 mg | Freq: Once | INTRAMUSCULAR | Status: AC
  Administered 2014-10-17: 60 mg via INTRAMUSCULAR

## 2014-10-17 MED ORDER — KETOROLAC TROMETHAMINE 60 MG/2ML IM SOLN
INTRAMUSCULAR | Status: AC
  Filled 2014-10-17: qty 2

## 2014-10-17 MED ORDER — INDOMETHACIN 25 MG PO CAPS: 25.0000 mg | ORAL_CAPSULE | Freq: Three times a day (TID) | ORAL | Status: DC

## 2014-10-17 NOTE — ED Provider Notes (Signed)
CSN: [id number]     Arrival date & time 10/17/14  1158 History   First MD Initiated Contact with Patient 10/17/14 1210     Chief Complaint  Patient presents with  . Trigeminal Neuralgia   (Consider location/radiation/quality/duration/timing/severity/associated sxs/prior Treatment) HPI Comments: Patient reports having been diagnosed with right sided trigeminal neuralgia 6-7 years ago. This condition has been managed by her neurologist and is typically treated with carbamazepine and gabapentin and occasional use of hydrocodone-APAP. Patient reports acute exacerbation of right facial pain began 10/14/2014 without additional illness or injury. Denies fever or dental issues. Has taken 1/2 tab of hydrocodone at home with limited relief.   The history is provided by the patient and the spouse.    Past Medical History  Diagnosis Date  . Trigeminal neuralgia   . Trigeminal neuralgia   . Diabetes mellitus without complication    Past Surgical History  Procedure Laterality Date  . Tubal ligation    . Wisdom tooth extraction     Family History  Problem Relation Age of Onset  . Cancer Maternal Grandfather     BLADDER  . Cancer Father     PROSTATE  . Diabetes Father   . Diabetes Mother   . Mental illness Mother     ANXIETY  . Aneurysm Mother     Small cerebral aneurysm  . Thyroid disease Maternal Aunt   . Cancer Maternal Aunt     LUNG  . Hypertension Maternal Uncle   . Diabetes Maternal Uncle   . Hypertension Maternal Aunt   . Diabetes Maternal Aunt   . Mental illness Maternal Aunt    History  Substance Use Topics  . Smoking status: Never Smoker   . Smokeless tobacco: Never Used  . Alcohol Use: No   OB History    Gravida Para Term Preterm AB TAB SAB Ectopic Multiple Living   Review of Systems  All other systems reviewed and are negative.   Allergies  Penicillins  Home Medications   Prior to Admission medications   Medication Sig Start Date End Date  Taking? Authorizing Provider  carbamazepine (CARBATROL) 300 MG 12 hr capsule Take 1 capsule (300 mg total) by mouth 2 (two) times daily. 09/18/14   Nilda Riggs, NP  metFORMIN (GLUCOPHAGE-XR) 500 MG 24 hr tablet Take 500 mg by mouth daily. 03/30/13   Historical Provider, MD   BP 100/76 mmHg  Pulse 79  Temp(Src) 98.3 F (36.8 C) (Oral)  Resp 16  SpO2 96% Physical Exam  Constitutional: She is oriented to person, place, and time. She appears well-developed and well-nourished.  +appears uncomfortable  HENT:  Head: Normocephalic and atraumatic.    Right Ear: Hearing, tympanic membrane, external ear and ear canal normal.  Left Ear: Hearing, tympanic membrane, external ear and ear canal normal.  Nose: Nose normal.  Mouth/Throat: Oropharynx is clear and moist and mucous membranes are normal. No oral lesions. There is trismus in the jaw. Normal dentition. No uvula swelling or dental caries.  Outlined area is region of discomfort  Eyes: Conjunctivae and EOM are normal. Pupils are equal, round, and reactive to light. Right eye exhibits no discharge. Left eye exhibits no discharge. No scleral icterus.  Neck: Normal range of motion. Neck supple. No tracheal deviation present.  Cardiovascular: Normal rate.   Pulmonary/Chest: Effort normal. No stridor.  Lymphadenopathy:    She has no cervical adenopathy.  Neurological: She is  alert and oriented to person, place, and time. She has normal strength. No cranial nerve deficit or sensory deficit. Coordination and gait normal. GCS eye subscore is 4. GCS verbal subscore is 5. GCS motor subscore is 6.  Skin: Skin is warm and dry. No rash noted. No erythema.  Psychiatric: She has a normal mood and affect. Her behavior is normal.  Nursing note and vitals reviewed.   ED Course  Procedures (including critical care time) Labs Review Labs Reviewed - No data to display  Imaging Review No results found.   MDM  No diagnosis found. Will add indocin  to patient's treatment regimen and advise follow up with her neurologist if symptoms persist. Patient given 60 mg IM toradol at [facility] for pain management.     Ria Clock, [place] 10/17/14 1249

## 2014-10-17 NOTE — ED Notes (Signed)
Patient reports a 6-7 year history of trigeminal neuralgia.  Reports pain is not controlled by usual methods.  This issue started last Monday.

## 2014-10-20 ENCOUNTER — Ambulatory Visit (INDEPENDENT_AMBULATORY_CARE_PROVIDER_SITE_OTHER): Payer: Medicaid Other | Admitting: Nurse Practitioner

## 2014-10-20 ENCOUNTER — Encounter: Payer: Self-pay | Admitting: Nurse Practitioner

## 2014-10-20 VITALS — BP 111/70 | HR 80 | Temp 98.2°F | Resp 16 | Ht 65.0 in | Wt 192.4 lb

## 2014-10-20 DIAGNOSIS — G500 Trigeminal neuralgia: Secondary | ICD-10-CM

## 2014-10-20 DIAGNOSIS — Z5181 Encounter for therapeutic drug level monitoring: Secondary | ICD-10-CM

## 2014-10-20 MED ORDER — CARBAMAZEPINE ER 300 MG PO CP12: 300.0000 mg | ORAL_CAPSULE | Freq: Three times a day (TID) | ORAL | Status: DC

## 2014-10-20 NOTE — Patient Instructions (Signed)
Will check labs today to include carbamazepine level Increase Carbatrol to 3 times a day, 5:30 AM 1:30 PM and 9:30 PM Continue Indocin when necessary Follow-up in 3 months

## 2014-10-20 NOTE — Progress Notes (Signed)
I have read the note, and I agree with the clinical assessment and plan.  Rowdy Guerrini KEITH   

## 2014-10-20 NOTE — Progress Notes (Signed)
[facility]  PATIENT: Kelly Charles DOB: 1976-01-06   REASON FOR VISIT: Follow-up for exacerbation of her trigeminal neuralgia  HISTORY OF PRESENT ILLNESS:Kelly Charles, 39 year old female returns for follow-up. She was last seen 09/18/14.  She has a history of trigeminal neuralgia which has responded well to Carbatrol twice a day since this was begun in January 2012.She was doing well when seen last month however on New Year's Eve she developed right facial pain and was seen in urgent care on New Year's Day.  Indocin was added to her drug regimen in the emergency room and she has received some relief. She developed facial pain in late July 2010, involving the right side of the face, sometimes the upper jaw and sometimes the lower jaw. When it began, she noticed discomfort with talking and with biting/chewing food on the right. She has failed Neurontin 600 in the past. She denies any other focal neurologic symptoms of numbness, weakness, paresthesia, visual symptoms, dysarthria, dysphagia, bowel or bladder symptoms. MRI of the brain was normal in the past. She returns for reevaluation and medication adjustment.  REVIEW OF SYSTEMS: Full 14 system review of systems performed and notable only for those listed, all others are neg:  Constitutional: N/A  Cardiovascular: N/A  Ear/Nose/Throat: N/A  Skin: N/A  Eyes: N/A  Respiratory: Chest tightness  Gastroitestinal: N/A  Hematology/Lymphatic: N/A  Endocrine: N/A Musculoskeletal:N/A  Allergy/Immunology: N/A  Neurological: Right facial pain Psychiatric: N/A Sleep : NA   ALLERGIES: Allergies  Allergen Reactions  . Penicillins Rash    HOME MEDICATIONS: Outpatient Prescriptions Prior to Visit  Medication Sig Dispense Refill  . carbamazepine (CARBATROL) 300 MG 12 hr capsule Take 1 capsule (300 mg total) by mouth 2 (two) times daily. 60 capsule 11  . indomethacin (INDOCIN) 25 MG capsule Take 1 capsule  (25 mg total) by mouth 3 (three) times daily with meals. As needed for pain 30 capsule 0  . metFORMIN (GLUCOPHAGE-XR) 500 MG 24 hr tablet Take 500 mg by mouth daily.     No facility-administered medications prior to visit.    PAST MEDICAL HISTORY: Past Medical History  Diagnosis Date  . Trigeminal neuralgia   . Trigeminal neuralgia   . Diabetes mellitus without complication     PAST SURGICAL HISTORY: Past Surgical History  Procedure Laterality Date  . Tubal ligation    . Wisdom tooth extraction      FAMILY HISTORY: Family History  Problem Relation Age of Onset  . Cancer Maternal Grandfather     BLADDER  . Cancer Father     PROSTATE  . Diabetes Father   . Diabetes Mother   . Mental illness Mother     ANXIETY  . Aneurysm Mother     Small cerebral aneurysm  . Thyroid disease Maternal Aunt   . Cancer Maternal Aunt     LUNG  . Hypertension Maternal Uncle   . Diabetes Maternal Uncle   . Hypertension Maternal Aunt   . Diabetes Maternal Aunt   . Mental illness Maternal Aunt     SOCIAL HISTORY: History   Social History  . Marital Status: Married    Spouse Name: N/A    Number of Children: 2  . Years of Education: 12   Occupational History  .  [employer]   Social History Main Topics  . Smoking status: Never Smoker   . Smokeless tobacco: Never Used  . Alcohol Use: No  . Drug Use: No  . Sexual Activity:  Partners: Male    Birth Control/ Protection: Other-see comments     Comment: BTL   Other Topics Concern  . Not on file   Social History Narrative     PHYSICAL EXAM  Filed Vitals:   10/20/14 1329  BP: 111/70  Pulse: 80  Temp: 98.2 F (36.8 C)  TempSrc: Oral  Resp: 16  Height:  (1.651 m)  Weight: 192 lb 6.4 oz (87.272 kg)   Body mass index is 32.02 kg/(m^2). General: well developed, obese female , seated, in no evident distress Head: head normocephalic and atraumatic. Oropharynx benign Neck: supple with no carotid  bruits  Neurologic Exam Mental Status: Awake and fully alert. Oriented to place and time. Follows all commands. Speech and language normal.  Cranial Nerves: Pupils equal, briskly reactive to light. Extraocular movements full without nystagmus. Visual fields full to confrontation. Hearing intact and symmetric to finger snap. Facial sensation intact. Face, tongue, palate move normally and symmetrically. Neck flexion and extension normal.  Motor: Normal bulk and tone. Normal strength in all tested extremity muscles.No focal weakness Sensory.: intact to touch and pinprick and vibratory.  Coordination: Rapid alternating movements normal in all extremities. Finger-to-nose and heel-to-shin performed accurately bilaterally. Gait and Station: Arises from chair without difficulty. Stance is normal. Gait demonstrates normal stride length and balance . Able to heel, toe and tandem walk without difficulty.  Reflexes: 2+ and symmetric. Toes downgoing.     DIAGNOSTIC DATA (LABS, IMAGING, TESTING) - I reviewed patient records, labs, notes, testing and imaging myself where available.     ASSESSMENT AND PLAN  39 y.o. year old female  has a past medical history of Trigeminal neuralgia;  and Diabetes mellitus without complication. here to follow-up for a flare of her trigeminal neuralgia. She is currently on Carbatrol 300 mg twice daily  Will check labs today to include carbamazepine level, CBC, CMP Increase Carbatrol to 3 times a day, 5:30 AM 1:30 PM and 9:30 PM Continue Indocin when necessary Follow-up in 3 months Nilda Riggs, [facility], [facility], APRN  [facility] [address], [address] [place], [place] [address] [phone]

## 2014-10-21 ENCOUNTER — Other Ambulatory Visit: Payer: Self-pay | Admitting: Nurse Practitioner

## 2014-10-21 ENCOUNTER — Telehealth: Payer: Self-pay | Admitting: Nurse Practitioner

## 2014-10-21 LAB — COMPREHENSIVE METABOLIC PANEL
ALT: 6 IU/L (ref 0–32)
AST: 10 IU/L (ref 0–40)
Albumin/Globulin Ratio: 1.3 (ref 1.1–2.5)
Albumin: 4.4 g/dL (ref 3.5–5.5)
Alkaline Phosphatase: 58 IU/L (ref 39–117)
BUN/Creatinine Ratio: 21 — ABNORMAL HIGH (ref 8–20)
BUN: 15 mg/dL (ref 6–20)
CO2: 22 mmol/L (ref 18–29)
Calcium: 9.8 mg/dL (ref 8.7–10.2)
Chloride: 105 mmol/L (ref 97–108)
Creatinine, Ser: 0.73 mg/dL (ref 0.57–1.00)
GFR calc Af Amer: 122 mL/min/{1.73_m2} (ref >59)
GFR calc non Af Amer: 106 mL/min/{1.73_m2} (ref >59)
Globulin, Total: 3.4 g/dL (ref 1.5–4.5)
Glucose: 91 mg/dL (ref 65–99)
Potassium: 4.9 mmol/L (ref 3.5–5.2)
Sodium: 142 mmol/L (ref 134–144)
Total Bilirubin: 0.2 mg/dL (ref 0.0–1.2)
Total Protein: 7.8 g/dL (ref 6.0–8.5)

## 2014-10-21 LAB — CBC WITH DIFFERENTIAL/PLATELET
Basophils Absolute: 0 10*3/uL (ref 0.0–0.2)
Basos: 1 %
Eos: 0 %
Eosinophils Absolute: 0 10*3/uL (ref 0.0–0.4)
HCT: 36.3 % (ref 34.0–46.6)
Hemoglobin: 11.5 g/dL (ref 11.1–15.9)
Immature Grans (Abs): 0 10*3/uL (ref 0.0–0.1)
Immature Granulocytes: 0 %
Lymphocytes Absolute: 2 10*3/uL (ref 0.7–3.1)
Lymphs: 41 %
MCH: 23.4 pg — ABNORMAL LOW (ref 26.6–33.0)
MCHC: 31.7 g/dL (ref 31.5–35.7)
MCV: 74 fL — ABNORMAL LOW (ref 79–97)
Monocytes Absolute: 0.4 10*3/uL (ref 0.1–0.9)
Monocytes: 7 %
Neutrophils Absolute: 2.6 10*3/uL (ref 1.4–7.0)
Neutrophils Relative %: 51 %
RBC: 4.91 x10E6/uL (ref 3.77–5.28)
RDW: 14.9 % (ref 12.3–15.4)
WBC: 5 10*3/uL (ref 3.4–10.8)

## 2014-10-21 LAB — CARBAMAZEPINE LEVEL, TOTAL: Carbamazepine Lvl: 6.6 ug/mL (ref 4.0–12.0)

## 2014-10-21 NOTE — Telephone Encounter (Signed)
Please let patient know labs ok.

## 2014-10-21 NOTE — Progress Notes (Unsigned)
This encounter was created in error - please disregard.

## 2014-10-21 NOTE — Telephone Encounter (Signed)
Patient was notified of labwork via private voicemail.  I instructed her that if she has any questions to please call and that I would send her a copy of her labs via mail.

## 2014-12-25 ENCOUNTER — Encounter: Payer: Self-pay | Admitting: Nurse Practitioner

## 2014-12-25 ENCOUNTER — Ambulatory Visit (INDEPENDENT_AMBULATORY_CARE_PROVIDER_SITE_OTHER): Payer: Medicaid Other | Admitting: Nurse Practitioner

## 2014-12-25 VITALS — BP 108/70 | HR 83 | Ht 65.5 in | Wt 196.0 lb

## 2014-12-25 DIAGNOSIS — G500 Trigeminal neuralgia: Secondary | ICD-10-CM | POA: Diagnosis not present

## 2014-12-25 NOTE — Progress Notes (Signed)
I have read the note, and I agree with the clinical assessment and plan.  Arlen Dupuis KEITH   

## 2014-12-25 NOTE — Progress Notes (Signed)
[facility]  PATIENT: Kelly Charles DOB: [birth year]   REASON FOR VISIT: Follow-up for trigeminal neuralgia  HISTORY FROM: Patient    HISTORY OF PRESENT ILLNESS:Kelly Charles, 39 year old female returns for follow-up. She was last seen 10/20/14.  She has a history of trigeminal neuralgia which has responded well to Carbatrol twice a day since this was begun in January 2012 however when last seen her right facial pain had flared. Carbatrol was increased to 3 tablets daily at that time with a level of 6.6. She denies any balance or other side effects to the medication increase. Indocin was added to her drug regimen in the emergency room and she has received some relief. She developed facial pain in late July 2010, involving the right side of the face, sometimes the upper jaw and sometimes the lower jaw. When it began, she noticed discomfort with talking and with biting/chewing food on the right. She has failed Neurontin 600 in the past. She denies any other focal neurologic symptoms of numbness, weakness, paresthesia, visual symptoms, dysarthria, dysphagia, bowel or bladder symptoms. MRI of the brain was normal in the past. She returns for reevaluation.  REVIEW OF SYSTEMS: Full 14 system review of systems performed and notable only for those listed, all others are neg:  Constitutional: neg  Cardiovascular: neg Ear/Nose/Throat: neg  Skin: neg Eyes: neg Respiratory: neg Gastroitestinal: neg  Hematology/Lymphatic: neg  Endocrine: neg Musculoskeletal:neg Allergy/Immunology: neg Neurological: neg Psychiatric: neg Sleep : neg   ALLERGIES: Allergies  Allergen Reactions  . Penicillins Rash    HOME MEDICATIONS: Outpatient Prescriptions Prior to Visit  Medication Sig Dispense Refill  . carbamazepine (CARBATROL) 300 MG 12 hr capsule Take 1 capsule (300 mg total) by mouth 3 (three) times daily. 90 capsule 6  . metFORMIN (GLUCOPHAGE-XR) 500 MG 24 hr  tablet Take 500 mg by mouth daily.    . indomethacin (INDOCIN) 25 MG capsule Take 1 capsule (25 mg total) by mouth 3 (three) times daily with meals. As needed for pain (Patient not taking: Reported on 12/25/2014) 30 capsule 0   No facility-administered medications prior to visit.    PAST MEDICAL HISTORY: Past Medical History  Diagnosis Date  . Trigeminal neuralgia   . Trigeminal neuralgia   . Diabetes mellitus without complication   . Trigeminal neuralgia     PAST SURGICAL HISTORY: Past Surgical History  Procedure Laterality Date  . Tubal ligation    . Wisdom tooth extraction      FAMILY HISTORY: Family History  Problem Relation Age of Onset  . Cancer Maternal Grandfather     BLADDER  . Cancer Father     PROSTATE  . Diabetes Father   . Diabetes Mother   . Mental illness Mother     ANXIETY  . Aneurysm Mother     Small cerebral aneurysm  . Thyroid disease Maternal Aunt   . Cancer Maternal Aunt     LUNG  . Hypertension Maternal Uncle   . Diabetes Maternal Uncle   . Hypertension Maternal Aunt   . Diabetes Maternal Aunt   . Mental illness Maternal Aunt     SOCIAL HISTORY: History   Social History  . Marital Status: Married    Spouse Name: N/A  . Number of Children: 2  . Years of Education: 12   Occupational History  .  [employer]   Social History Main Topics  . Smoking status: Never Smoker   . Smokeless tobacco: Never Used  . Alcohol Use: No  .  Drug Use: No  . Sexual Activity:    Partners: Male    Birth Control/ Protection: Other-see comments     Comment: BTL   Other Topics Concern  . Not on file   Social History Narrative     PHYSICAL EXAM  Filed Vitals:   12/25/14 0853  BP: 108/70  Pulse: 83  Height: 5' 5.5" (1.664 m)  Weight: 196 lb (88.905 kg)   Body mass index is 32.11 kg/(m^2). General: well developed, obese female , seated, in no evident distress Head: head normocephalic and atraumatic. Oropharynx benign Neck: supple  with no carotid bruits  Neurologic Exam Mental Status: Awake and fully alert. Oriented to place and time. Follows all commands. Speech and language normal.  Cranial Nerves: Pupils equal, briskly reactive to light. Extraocular movements full without nystagmus. Visual fields full to confrontation. Hearing intact and symmetric to finger snap. Facial sensation intact. Face, tongue, palate move normally and symmetrically. Neck flexion and extension normal.  Motor: Normal bulk and tone. Normal strength in all tested extremity muscles.No focal weakness Sensory.: intact to touch and pinprick and vibratory.  Coordination: Rapid alternating movements normal in all extremities. Finger-to-nose and heel-to-shin performed accurately bilaterally. Gait and Station: Arises from chair without difficulty. Stance is normal. Gait demonstrates normal stride length and balance . Able to heel, toe and tandem walk without difficulty.  Reflexes: 2+ and symmetric. Toes downgoing.      DIAGNOSTIC DATA (LABS, IMAGING, TESTING) - I reviewed patient records, labs, notes, testing and imaging myself where available.  Lab Results  Component Value Date   WBC 5.0 10/20/2014   HGB 11.5 10/20/2014   HCT 36.3 10/20/2014   MCV 74* 10/20/2014   PLT 276 09/27/2007      Component Value Date/Time   NA 142 10/20/2014 1406   K 4.9 10/20/2014 1406   CL 105 10/20/2014 1406   CO2 22 10/20/2014 1406   GLUCOSE 91 10/20/2014 1406   BUN 15 10/20/2014 1406   CREATININE 0.73 10/20/2014 1406   CALCIUM 9.8 10/20/2014 1406   PROT 7.8 10/20/2014 1406   AST 10 10/20/2014 1406   ALT 6 10/20/2014 1406   ALKPHOS 58 10/20/2014 1406   BILITOT 0.2 10/20/2014 1406   GFRNONAA 106 10/20/2014 1406   GFRAA 122 10/20/2014 1406    ASSESSMENT AND PLAN  39 y.o. year old female  has a past medical history of Trigeminal neuralgia; Trigeminal neuralgia; Diabetes mellitus without complication; and Trigeminal neuralgia. here to follow-up. She  had a flare in January 2016 and Carbatrol was increased to 3 tablets daily with good results. Carbamazepine level 6.6 which is therapeutic. No side effects to increase in the medication  Continue Carbatrol 3 times a day Call for exacerbation of trigeminal neuralgia otherwise Follow-up yearly and when necessary Nilda Riggs, [facility], APRN  [facility] [address] [place], [place] [address] [phone]

## 2014-12-25 NOTE — Patient Instructions (Signed)
Continue Carbatrol 3 times a day Call for exacerbation of trigeminal neuralgia otherwise Follow-up yearly and when necessary

## 2015-01-13 ENCOUNTER — Emergency Department (HOSPITAL_COMMUNITY)
Admission: EM | Admit: 2015-01-13 | Discharge: 2015-01-13 | Disposition: A | Discharge status: Home / Self Care | Payer: Medicaid Other | Attending: Emergency Medicine | Admitting: Emergency Medicine

## 2015-01-13 ENCOUNTER — Encounter (HOSPITAL_COMMUNITY): Payer: Self-pay | Admitting: *Deleted

## 2015-01-13 DIAGNOSIS — T886XXA Anaphylactic reaction due to adverse effect of correct drug or medicament properly administered, initial encounter: Secondary | ICD-10-CM | POA: Insufficient documentation

## 2015-01-13 DIAGNOSIS — E119 Type 2 diabetes mellitus without complications: Secondary | ICD-10-CM | POA: Insufficient documentation

## 2015-01-13 DIAGNOSIS — T50905A Adverse effect of unspecified drugs, medicaments and biological substances, initial encounter: Secondary | ICD-10-CM

## 2015-01-13 DIAGNOSIS — Z880 Allergy status to penicillin: Secondary | ICD-10-CM | POA: Diagnosis not present

## 2015-01-13 DIAGNOSIS — T368X5A Adverse effect of other systemic antibiotics, initial encounter: Secondary | ICD-10-CM | POA: Diagnosis not present

## 2015-01-13 DIAGNOSIS — Z79899 Other long term (current) drug therapy: Secondary | ICD-10-CM | POA: Diagnosis not present

## 2015-01-13 DIAGNOSIS — Z8669 Personal history of other diseases of the nervous system and sense organs: Secondary | ICD-10-CM | POA: Insufficient documentation

## 2015-01-13 DIAGNOSIS — T7840XA Allergy, unspecified, initial encounter: Secondary | ICD-10-CM | POA: Diagnosis present

## 2015-01-13 DIAGNOSIS — T782XXA Anaphylactic shock, unspecified, initial encounter: Secondary | ICD-10-CM

## 2015-01-13 MED ORDER — PREDNISONE 20 MG PO TABS: ORAL_TABLET | ORAL | Status: DC

## 2015-01-13 MED ORDER — EPINEPHRINE 0.3 MG/0.3ML IJ SOAJ
0.3000 mg | Freq: Once | INTRAMUSCULAR | Status: AC
  Administered 2015-01-13: 0.3 mg via INTRAMUSCULAR
  Filled 2015-01-13: qty 0.3

## 2015-01-13 MED ORDER — FAMOTIDINE 20 MG PO TABS: 20.0000 mg | ORAL_TABLET | Freq: Two times a day (BID) | ORAL | Status: DC

## 2015-01-13 MED ORDER — FAMOTIDINE IN NACL 20-0.9 MG/50ML-% IV SOLN
20.0000 mg | Freq: Once | INTRAVENOUS | Status: AC
  Administered 2015-01-13: 20 mg via INTRAVENOUS
  Filled 2015-01-13: qty 50

## 2015-01-13 MED ORDER — EPINEPHRINE 0.3 MG/0.3ML IJ SOAJ: 0.3000 mg | Freq: Once | INTRAMUSCULAR | Status: DC | PRN

## 2015-01-13 MED ORDER — SODIUM CHLORIDE 0.9 % IV BOLUS (SEPSIS)
1000.0000 mL | Freq: Once | INTRAVENOUS | Status: AC
  Administered 2015-01-13: 1000 mL via INTRAVENOUS

## 2015-01-13 MED ORDER — DIPHENHYDRAMINE HCL 50 MG/ML IJ SOLN
50.0000 mg | Freq: Once | INTRAMUSCULAR | Status: AC
  Administered 2015-01-13: 50 mg via INTRAVENOUS
  Filled 2015-01-13 (×2): qty 1

## 2015-01-13 MED ORDER — METHYLPREDNISOLONE SODIUM SUCC 125 MG IJ SOLR
125.0000 mg | Freq: Once | INTRAMUSCULAR | Status: AC
  Administered 2015-01-13: 125 mg via INTRAVENOUS
  Filled 2015-01-13: qty 2

## 2015-01-13 NOTE — ED Notes (Signed)
Patient placed on cardiac monitor. Spouse at bedside.

## 2015-01-13 NOTE — ED Notes (Signed)
IV converted to saline lock.  Patient has scant number of hives to r arm and bilateral upper thighs. No acute distress noted.

## 2015-01-13 NOTE — ED Provider Notes (Signed)
Patient seen/examined in the Emergency Department in conjunction with Midlevel Provider  Patient reports allergic rxn Exam : awake/alert, no angioedema noted (pt feels improved after epipen) Plan: monitor in ED for 4 hrs then d/c home with epipen    Zadie Rhineonald Christalynn Boise, MD 01/13/15 1230

## 2015-01-13 NOTE — ED Provider Notes (Signed)
CSN: [id number]     Arrival date & time 01/13/15  1053 History   First MD Initiated Contact with Patient 01/13/15 1107     Chief Complaint  Patient presents with  . Allergic Reaction     (Consider location/radiation/quality/duration/timing/severity/associated sxs/prior Treatment) HPI   Kelly Charles is a 39 y.o. female non-insulin-dependent diabetes complaining of hives and itching onset late last night with associated shortness of breath, sensation that her throat is closing and nausea with no vomiting in addition to bilateral lower extremity aching all onset when she awoke this morning at 9 AM. She states that the symptoms are stable, not progressively worsening but not improving. She's had no medication. She recently started Levaquin, she had 1 pill yesterday for a sinusitis and otitis media. She's never had any history of anaphylactic reaction, she is allergic to penicillin and her reaction is hives.   Past Medical History  Diagnosis Date  . Trigeminal neuralgia   . Trigeminal neuralgia   . Diabetes mellitus without complication   . Trigeminal neuralgia    Past Surgical History  Procedure Laterality Date  . Tubal ligation    . Wisdom tooth extraction     Family History  Problem Relation Age of Onset  . Cancer Maternal Grandfather     BLADDER  . Cancer Father     PROSTATE  . Diabetes Father   . Diabetes Mother   . Mental illness Mother     ANXIETY  . Aneurysm Mother     Small cerebral aneurysm  . Thyroid disease Maternal Aunt   . Cancer Maternal Aunt     LUNG  . Hypertension Maternal Uncle   . Diabetes Maternal Uncle   . Hypertension Maternal Aunt   . Diabetes Maternal Aunt   . Mental illness Maternal Aunt    History  Substance Use Topics  . Smoking status: Never Smoker   . Smokeless tobacco: Never Used  . Alcohol Use: No   OB History    Gravida Para Term Preterm AB TAB SAB Ectopic Multiple Living   Review of Systems  10  systems reviewed and found to be negative, except as noted in the HPI.   Allergies  Penicillins  Home Medications   Prior to Admission medications   Medication Sig Start Date End Date Taking? Authorizing Provider  carbamazepine (CARBATROL) 300 MG 12 hr capsule Take 1 capsule (300 mg total) by mouth 3 (three) times daily. 10/20/14   Lynder Parents, NP  indomethacin (INDOCIN) 25 MG capsule Take 1 capsule (25 mg total) by mouth 3 (three) times daily with meals. As needed for pain Patient not taking: Reported on 12/25/2014 10/17/14   Mathis Fare Presson, PA  metFORMIN (GLUCOPHAGE-XR) 500 MG 24 hr tablet Take 500 mg by mouth daily. 03/30/13   Historical Provider, MD  oseltamivir (TAMIFLU) 75 MG capsule Take 75 mg by mouth.    Historical Provider, MD   BP 99/65 mmHg  Pulse 114  Temp(Src) 99.9 F (37.7 C) (Oral)  Resp 18  SpO2 100% Physical Exam  Constitutional: She is oriented to person, place, and time. She appears well-developed and well-nourished. No distress.  HENT:  Head: Normocephalic and atraumatic.  Mouth/Throat: Oropharynx is clear and moist.  No stridor or drooling. No posterior pharynx edema, lip or tongue swelling. Pt reclining comfortably, speaking in complete sentences.   No Wheezing, excellent air movement in all fields.  No drooling or stridor. Posterior pharynx mildly erythematous no significant tonsillar hypertrophy. No exudate. Soft palate rises symmetrically. No TTP or induration under tongue.   No tenderness to palpation of frontal or bilateral maxillary sinuses.  No mucosal edema in the nares.  Bilateral tympanic membranes with normal architecture and good light reflex.      Eyes: Conjunctivae and EOM are normal. Pupils are equal, round, and reactive to light.  Neck: Normal range of motion.  Cardiovascular: Normal rate, regular rhythm and intact distal pulses.   Pulmonary/Chest: Effort normal and breath sounds normal. No stridor. No respiratory distress. She  has no wheezes. She has no rales. She exhibits no tenderness.  Abdominal: Soft. Bowel sounds are normal. She exhibits no distension and no mass. There is no tenderness. There is no rebound and no guarding.  Musculoskeletal: Normal range of motion.  Neurological: She is alert and oriented to person, place, and time.  Skin: Rash noted.  Hive-Like rash to bilateral upper and lower extremities and torso. Rash bears palms soles or mucous membranes  Psychiatric: She has a normal mood and affect.  Nursing note and vitals reviewed.   ED Course  Procedures (including critical care time) Labs Review Labs Reviewed - No data to display  Imaging Review No results found.   EKG Interpretation None      MDM   Final diagnoses:  Drug-induced anaphylaxis, initial encounter    Filed Vitals:   01/13/15 1212 01/13/15 1328 01/13/15 1507 01/13/15 1510  BP: 110/66 110/59 114/67   Pulse: 102 102 96   Temp:      TempSrc:      Resp: 16 20 22    SpO2: 97% 96% 96% 96%    Medications  sodium chloride 0.9 % bolus 1,000 mL (0 mLs Intravenous Stopped 01/13/15 1421)  methylPREDNISolone sodium succinate (SOLU-MEDROL) 125 mg/2 mL injection 125 mg (125 mg Intravenous Given 01/13/15 1138)  famotidine (PEPCID) IVPB 20 mg (0 mg Intravenous Stopped 01/13/15 1230)  diphenhydrAMINE (BENADRYL) injection 50 mg (50 mg Intravenous Given 01/13/15 1138)  EPINEPHrine (EPI-PEN) injection 0.3 mg (0.3 mg Intramuscular Given 01/13/15 1201)    Kelly Charles is a pleasant 39 y.o. female presenting with recurrent onset last night associated with sensation of shortness of breath and dyspepsia. Patient is saturating well on room air, she is not to She is not wheezing, she speaking in complete sentences, there is no angioedema. She is mildly tachycardic. Considering this constellation of symptoms and the multiorgan involvement epinephrine will be administered for anaphylactic reaction.  Patient reports significant  subjective improvement after allergy cocktail, bolus and epinephrine. Plan is to watch this patient in the ED and reassess for biphasic reaction.  Patient remains asymptomatic while in the ED. We've had a discussion as to whether or not to change her antibiotics, she was being treated for a sinusitis with the Levaquin but I feel that her symptoms are more allergic in nature rather than bacterial. Patient will follow closely with her primary care physician of advised her to please list Levaquin as an anaphylactic allergy and future. Patient verbalized her understanding.  Evaluation does not show pathology that would require ongoing emergent intervention or inpatient treatment. Pt is hemodynamically stable and mentating appropriately. Discussed findings and plan with patient/guardian, who agrees with care plan. All questions answered. Return precautions discussed and outpatient follow up given.   Discharge Medication List as of 01/13/2015  2:54 PM    START taking these medications   Details  EPINEPHrine (EPIPEN  2-PAK) 0.3 mg/0.3 mL IJ SOAJ injection Inject 0.3 mLs (0.3 mg total) into the muscle once as needed (for severe allergic reaction). CAll 911 immediately if you have to use this medicine, Starting 01/13/2015, Until Discontinued, Print    famotidine (PEPCID) 20 MG tablet Take 1 tablet (20 mg total) by mouth 2 (two) times daily., Starting 01/13/2015, Until Discontinued, Print    predniSONE (DELTASONE) 20 MG tablet 3 tabs po day one, then 2 po daily x 4 days, Print             Wynetta Emery, PA-C 01/13/15 1549  Zadie Rhine, MD 01/14/15 0830

## 2015-01-13 NOTE — Discharge Instructions (Signed)
If you have minister your EpiPen for severe allergic reaction (shortness of breath, feeling lightheaded or passing out in addition to hives and dyspepsia) you must 911 immediately for evaluation.  Going forward please list Levaquin as a allergy and tell all of your providers that you have had an anaphylactic reaction to this.  Please follow with your primary care doctor in the next 2 days for a check-up. They must obtain records for further management.   Do not hesitate to return to the Emergency Department for any new, worsening or concerning symptoms.   1 to 2 tablets of 25 mg Benadryl pills every 4-6 hours as needed to a maximum of 300 mg per day. In addition, you may apply a topical hydrocortisone ointment to all affected areas except for the face.   Do not hesitate to call 911 or return to the emergency room if you develop any shortness of breath, wheezing, tongue or lip swelling.

## 2015-01-13 NOTE — ED Notes (Addendum)
Pt reports seen and treated by pcp yesterday for ear infection, prescribed levaquin. Took 1 dose last night, started having hives and itching. This morning woke up with SOB.reports throat feels a little tight. Slight expiratory wheezing heard in lower lung fields. Able to speak in short sentences. 100% on room air. bil leg pain starting this morning 7/10 pain.

## 2015-04-30 ENCOUNTER — Telehealth: Payer: Self-pay | Admitting: Nurse Practitioner

## 2015-04-30 NOTE — Telephone Encounter (Signed)
CMP within normal limits from 04/27/15. CBZ level was 9.7.

## 2015-07-10 ENCOUNTER — Other Ambulatory Visit: Payer: Self-pay | Admitting: Nurse Practitioner

## 2015-08-06 ENCOUNTER — Encounter: Payer: Self-pay | Admitting: Nurse Practitioner

## 2015-08-06 ENCOUNTER — Ambulatory Visit (INDEPENDENT_AMBULATORY_CARE_PROVIDER_SITE_OTHER): Payer: Medicaid Other | Admitting: Nurse Practitioner

## 2015-08-06 VITALS — BP 124/74 | HR 91 | Ht 65.0 in | Wt 187.8 lb

## 2015-08-06 DIAGNOSIS — G500 Trigeminal neuralgia: Secondary | ICD-10-CM

## 2015-08-06 MED ORDER — PREDNISONE 10 MG PO TABS: ORAL_TABLET | ORAL | Status: DC

## 2015-08-06 NOTE — Progress Notes (Signed)
[facility]  PATIENT: Kelly Charles DOB: [birth year]   REASON FOR VISIT: Follow-up for exacerbation of trigeminal neuralgia HISTORY FROM: Patient and mother    HISTORY OF PRESENT ILLNESS:Kelly Charles, 39 year old female returns for follow-up. She was last seen 12/25/14.   She has a history of trigeminal neuralgia which has responded well to Carbatrol three times a day , this was begun in January 2012. Most  recent Carbatrol level was 9.7 on 05/31/2015. She denies any balance or other side effects to the medication . She is currently having an acute flare and is worsened since Monday. She has been taking some of her mom's gabapentin however gabapentin has not worked for her in the past. She has been unable to eat for  a couple of days History of facial pain since  July 2010, involving the right side of the face, sometimes the upper jaw and sometimes the lower jaw. When it began, she noticed discomfort with talking and with biting/chewing food on the right. She has failed Neurontin 600 in the past. She denies any other focal neurologic symptoms of numbness, weakness, paresthesia, visual symptoms,  dysphagia, bowel or bladder symptoms. MRI of the brain was normal in the past. She returns for reevaluation.    REVIEW OF SYSTEMS: Full 14 system review of systems performed and notable only for those listed, all others are neg:  Constitutional: neg  Cardiovascular: neg Ear/Nose/Throat: neg  Skin: neg Eyes: neg Respiratory: neg Gastroitestinal: neg  Hematology/Lymphatic: neg  Endocrine: neg Musculoskeletal:neg Allergy/Immunology: neg Neurological: Headache, speech difficulty facial pain Psychiatric: neg Sleep : neg   ALLERGIES: Allergies  Allergen Reactions  . Levaquin [Levofloxacin] Anaphylaxis    Hives, dyspepsia, shortness of breath  . Penicillins Rash    HOME MEDICATIONS: Outpatient Prescriptions Prior to Visit  Medication Sig Dispense  Refill  . carbamazepine (CARBATROL) 300 MG 12 hr capsule TAKE ONE CAPSULE BY MOUTH THREE TIMES DAILY 90 capsule 6  . EPINEPHrine (EPIPEN 2-PAK) 0.3 mg/0.3 mL IJ SOAJ injection Inject 0.3 mLs (0.3 mg total) into the muscle once as needed (for severe allergic reaction). CAll 911 immediately if you have to use this medicine 1 Device 1  . metFORMIN (GLUCOPHAGE-XR) 500 MG 24 hr tablet Take 500 mg by mouth daily.    . famotidine (PEPCID) 20 MG tablet Take 1 tablet (20 mg total) by mouth 2 (two) times daily. (Patient not taking: Reported on 08/06/2015) 10 tablet 0  . ibuprofen (ADVIL,MOTRIN) 100 MG tablet Take 200 mg by mouth every 6 (six) hours as needed for pain or fever.    Marland Kitchen. levofloxacin (LEVAQUIN) 500 MG tablet Take 500 mg by mouth daily.    . predniSONE (DELTASONE) 20 MG tablet 3 tabs po day one, then 2 po daily x 4 days (Patient not taking: Reported on 08/06/2015) 11 tablet 0   No facility-administered medications prior to visit.    PAST MEDICAL HISTORY: Past Medical History  Diagnosis Date  . Trigeminal neuralgia   . Trigeminal neuralgia   . Diabetes mellitus without complication (HCC)   . Trigeminal neuralgia     PAST SURGICAL HISTORY: Past Surgical History  Procedure Laterality Date  . Tubal ligation    . Wisdom tooth extraction      FAMILY HISTORY: Family History  Problem Relation Age of Onset  . Cancer Maternal Grandfather     BLADDER  . Cancer Father     PROSTATE  . Diabetes Father   . Diabetes Mother   . Mental  illness Mother     ANXIETY  . Aneurysm Mother     Small cerebral aneurysm  . Thyroid disease Maternal Aunt   . Cancer Maternal Aunt     LUNG  . Hypertension Maternal Uncle   . Diabetes Maternal Uncle   . Hypertension Maternal Aunt   . Diabetes Maternal Aunt   . Mental illness Maternal Aunt     SOCIAL HISTORY: Social History   Social History  . Marital Status: Married    Spouse Name: N/A  . Number of Children: 2  . Years of Education: 12    Occupational History  .  [employer]   Social History Main Topics  . Smoking status: Never Smoker   . Smokeless tobacco: Never Used  . Alcohol Use: No  . Drug Use: No  . Sexual Activity:    Partners: Male    Birth Control/ Protection: Other-see comments     Comment: BTL   Other Topics Concern  . Not on file   Social History Narrative     PHYSICAL EXAM  Filed Vitals:   08/06/15 0755  BP: 124/74  Pulse: 91  Height:  (1.651 m)  Weight: 187 lb 12.8 oz (85.186 kg)   Body mass index is 31.25 kg/(m^2). General: well developed, obese female , seated, in no evident distress Head: head normocephalic and atraumatic. Oropharynx benign Neck: supple with no carotid bruits  Neurologic Exam Mental Status: Awake and fully alert. Oriented to place and time. Follows all commands. Speech and language normal.  Cranial Nerves: Pupils equal, briskly reactive to light. Extraocular movements full without nystagmus. Visual fields full to confrontation. Hearing intact and symmetric to finger snap. Facial sensation intact. Face, tongue, palate move normally and symmetrically. Neck flexion and extension normal.  Motor: Normal bulk and tone. Normal strength in all tested extremity muscles.No focal weakness Sensory.: intact to touch in the face and extremities.  Coordination: Rapid alternating movements normal in all extremities. Finger-to-nose and heel-to-shin performed accurately bilaterally. Gait and Station: Arises from chair without difficulty. Stance is normal. Gait demonstrates normal stride length and balance . Able to heel, toe and tandem walk without difficulty.  Reflexes: 2+ and symmetric. Toes downgoing.  DIAGNOSTIC DATA (LABS, IMAGING, TESTING) - I reviewed patient records, labs, notes, testing and imaging myself where available.  Lab Results  Component Value Date   WBC 5.0 10/20/2014   HGB 11.5 10/20/2014   HCT 36.3 10/20/2014   MCV 74* 10/20/2014   PLT 276  09/27/2007      Component Value Date/Time   NA 142 10/20/2014 1406   K 4.9 10/20/2014 1406   CL 105 10/20/2014 1406   CO2 22 10/20/2014 1406   GLUCOSE 91 10/20/2014 1406   BUN 15 10/20/2014 1406   CREATININE 0.73 10/20/2014 1406   CALCIUM 9.8 10/20/2014 1406   PROT 7.8 10/20/2014 1406   ALBUMIN 4.4 10/20/2014 1406   AST 10 10/20/2014 1406   ALT 6 10/20/2014 1406   ALKPHOS 58 10/20/2014 1406   BILITOT 0.2 10/20/2014 1406   GFRNONAA 106 10/20/2014 1406   GFRAA 122 10/20/2014 1406    ASSESSMENT AND PLAN  39 y.o. year old female  has a past medical history of Trigeminal neuralgia;  Diabetes mellitus without complication (HCC);  here to follow-up for acute right facial pain   Prednisone 6 day dose pack as directed Continue Carbatrol 300 mg 3 times daily Begin Lyrica 50 mg daily for 4 days then increase to twice daily Patient  has failed gabapentin in the past Follow-up in 6 months. Vst time 20 min Nilda Riggs, [facility], [facility], APRN  [facility] [address] [place], [place] [address] [phone]

## 2015-08-06 NOTE — Patient Instructions (Addendum)
Prednisone 6 day dose pack as directed Continue Carbatrol 300 mg 3 times daily Begin Lyrica 50 mg daily for 4 days then increase to twice daily Patient has failed gabapentin in the past Follow-up in 6 months

## 2015-08-06 NOTE — Progress Notes (Signed)
I have read the note, and I agree with the clinical assessment and plan.  Naly Schwanz KEITH   

## 2015-08-10 ENCOUNTER — Telehealth: Payer: Self-pay | Admitting: *Deleted

## 2015-08-10 ENCOUNTER — Ambulatory Visit: Payer: Medicaid Other | Admitting: Nurse Practitioner

## 2015-08-10 MED ORDER — CARBAMAZEPINE ER 300 MG PO CP12: 300.0000 mg | ORAL_CAPSULE | Freq: Four times a day (QID) | ORAL | Status: DC

## 2015-08-10 NOTE — Telephone Encounter (Signed)
Pt here with husband.  Walk in.  Having trigeminal neuralgia exac.  Taking the lyrica, prednisone as well as carbatrol.  Has increase of pain over weekend at level 10.  Husband with her as pt cannot talk.  CM/NP aware and will be speaking with Dr. Anne HahnWillis.

## 2015-08-10 NOTE — Telephone Encounter (Signed)
Went out to lobby  to talk with patient. Difficulty eating for a few days. Hurts to speak. Patient was just seen Thursday last week. Discussed with Dr. Anne HahnWillis. Increase Carbitrol to 4 caps daily. Complete prednisone dose pack. Increase Lyrica to 100mg  today then increase by 50 mg every 4 days. If pain does not subside by next week call back and [facility] Patient has failed Gabapentin in the past. Will call in RX to increase Carbitrol.

## 2015-08-17 ENCOUNTER — Other Ambulatory Visit: Payer: Self-pay | Admitting: *Deleted

## 2015-08-17 ENCOUNTER — Other Ambulatory Visit (INDEPENDENT_AMBULATORY_CARE_PROVIDER_SITE_OTHER): Payer: Self-pay

## 2015-08-17 ENCOUNTER — Telehealth: Payer: Self-pay | Admitting: *Deleted

## 2015-08-17 DIAGNOSIS — G500 Trigeminal neuralgia: Secondary | ICD-10-CM

## 2015-08-17 DIAGNOSIS — Z0289 Encounter for other administrative examinations: Secondary | ICD-10-CM

## 2015-08-17 NOTE — Telephone Encounter (Signed)
Pt here for lab draw of carbatrol.  She takes her carbatrol 300mg  po qid.  (6-11-4-9p). Lab was drawn, not trough.  Re do at trough?   Also asked question about lyrica (she is now taking 150mg  po daily).  She asked if to stop or increase again.  I would call her back.

## 2015-08-17 NOTE — Addendum Note (Signed)
Addended byHermenia Fiscal: YOUNG, SANDRA on: 08/17/2015 04:18 PM   Modules accepted: Orders

## 2015-08-17 NOTE — Progress Notes (Signed)
Carbatrol level for trigeminal neuralgia.

## 2015-08-17 NOTE — Telephone Encounter (Signed)
Unfortunately we cannot increase med on a random draw.(carbatrol).  Hers would be elevated if she had the draw at 312 and took the medication at 11am. She can increase the Lyrica to 200mg  daily if she has been on the 150mg  dose for 4 days. Does she feel the Lyrica has helped ?

## 2015-08-17 NOTE — Telephone Encounter (Signed)
Spoke to pt and she will come and get trough level in the am.  (last dose 2100 and will take her am dose after lab drawn).   She has taken the lyrica 150mg  po for 4 days.  I told her that she can increase this to 200mg  po daily as per note below.   She verbalized understanding.

## 2015-08-17 NOTE — Telephone Encounter (Signed)
noted 

## 2015-08-18 ENCOUNTER — Other Ambulatory Visit (INDEPENDENT_AMBULATORY_CARE_PROVIDER_SITE_OTHER): Payer: Self-pay

## 2015-08-18 DIAGNOSIS — Z0289 Encounter for other administrative examinations: Secondary | ICD-10-CM

## 2015-08-18 DIAGNOSIS — G500 Trigeminal neuralgia: Secondary | ICD-10-CM

## 2015-08-19 ENCOUNTER — Telehealth: Payer: Self-pay | Admitting: Nurse Practitioner

## 2015-08-19 DIAGNOSIS — G500 Trigeminal neuralgia: Secondary | ICD-10-CM

## 2015-08-19 LAB — CARBAMAZEPINE LEVEL, TOTAL: Carbamazepine Lvl: 8.2 ug/mL (ref 4.0–12.0)

## 2015-08-19 MED ORDER — PREGABALIN 100 MG PO CAPS: 100.0000 mg | ORAL_CAPSULE | Freq: Three times a day (TID) | ORAL | Status: AC

## 2015-08-19 MED ORDER — CARBAMAZEPINE ER 300 MG PO CP12: 300.0000 mg | ORAL_CAPSULE | Freq: Every day | ORAL | Status: DC

## 2015-08-19 NOTE — Telephone Encounter (Signed)
TC to patient CBZ level 8.2. Will increase Carbitrol to 300mg  (5) times daily. May increase Lyrica by 50mg  on Friday the 4th and additional 50mg  in 4 days after that for total dose of 300mg . Left a message, call back for questions. [facility]  Pt called back explained current message, she will pick up 50mg  Lyrica samples. I will call in 100 mg caps and Carbitrol increase.

## 2015-08-27 ENCOUNTER — Telehealth: Payer: Self-pay | Admitting: Nurse Practitioner

## 2015-08-27 NOTE — Telephone Encounter (Signed)
Patient's mother is calling to get a prior authorization for pregabalin (LYRICA) 100 MG capsule for the patient. [facility] on [address] says the Rx needs prior authorization. The patient is out of medication and would like samples if we have them. Please call and advise. Thank you.

## 2015-08-27 NOTE — Telephone Encounter (Signed)
Ins has been contacted and provided with clinical info.  The request for coverage on Lyrica has been approved effective until 08/22/2016 Ref # [id number].  I called and spoke with the pharmacist, who verified the Rx does go through ins for $3 co-pay.  I called the patient back.  Relayed this info.  She expressed understanding and appreciation.

## 2015-08-27 NOTE — Telephone Encounter (Signed)
I have spoken with the patient earlier who is aware the medication was approved.  I called back and spoke with Ms Yetta BarreJones.  She is aware the medication was approved.  Explained when we called her line earlier, the line was busy.  Apologized for any confusion.

## 2015-08-27 NOTE — Telephone Encounter (Signed)
Patient and her Mother came in the office and was very upset because she said no one (the mother) contacted her about her daughters information about the prescription and that her daughter has been in so much pain waiting on from someone to contact them back.

## 2015-09-01 DIAGNOSIS — Z0289 Encounter for other administrative examinations: Secondary | ICD-10-CM

## 2015-09-21 ENCOUNTER — Ambulatory Visit: Payer: Medicaid Other | Admitting: Nurse Practitioner

## 2015-09-30 DIAGNOSIS — Z131 Encounter for screening for diabetes mellitus: Secondary | ICD-10-CM | POA: Insufficient documentation

## 2015-10-01 HISTORY — PX: CEREBRAL MICROVASCULAR DECOMPRESSION: SHX1328

## 2015-11-04 DIAGNOSIS — Z9889 Other specified postprocedural states: Secondary | ICD-10-CM | POA: Insufficient documentation

## 2016-02-04 ENCOUNTER — Ambulatory Visit (INDEPENDENT_AMBULATORY_CARE_PROVIDER_SITE_OTHER): Payer: Self-pay | Admitting: Nurse Practitioner

## 2016-02-04 ENCOUNTER — Encounter: Payer: Self-pay | Admitting: Nurse Practitioner

## 2016-02-04 VITALS — BP 121/83 | HR 93 | Ht 65.0 in | Wt 200.8 lb

## 2016-02-04 DIAGNOSIS — G500 Trigeminal neuralgia: Secondary | ICD-10-CM

## 2016-02-04 NOTE — Progress Notes (Signed)
[facility]  PATIENT: Kelly Charles DOB: 1976/02/21   REASON FOR VISIT: Follow-up for trigeminal neuralgia HISTORY FROM: Patient    HISTORY OF PRESENT ILLNESS::Kelly Charles, 40 year old female returns for follow-up. She was last seen in the office on 08/06/2015. At that time she was on Carbatrol 300 mg 3 times a day and she was continuing to have discomfort with biting and chewing,  having a significant flare of her trigeminal neuralgia. She had failed gabapentin in the past. She was unable to eat. She was placed on Lyrica and prednisone and asked to be evaluated at [facility] for gamma knife. She had microvascular decompression done in October 01, 2015. She is off her Carbatrol and her Lyrica now. She is not having any facial pain. She returns for reevaluation   HISTORY: Kelly Charles, 40 year old female returns for follow-up. She was last seen 12/25/14.  She has a history of trigeminal neuralgia which has responded well to Carbatrol three times a day , this was begun in January 2012. Most recent Carbatrol level was 9.7 on 05/31/2015. She denies any balance or other side effects to the medication . She is currently having an acute flare and is worsened since Monday. She has been taking some of her mom's gabapentin however gabapentin has not worked for her in the past. She has been unable to eat for a couple of days History of facial pain since July 2010, involving the right side of the face, sometimes the upper jaw and sometimes the lower jaw. When it began, she noticed discomfort with talking and with biting/chewing food on the right. She has failed Neurontin 600 in the past. She denies any other focal neurologic symptoms of numbness, weakness, paresthesia, visual symptoms, dysphagia, bowel or bladder symptoms. MRI of the brain was normal in the past. She returns for reevaluation.    REVIEW OF SYSTEMS: Full 14 system review of systems  performed and notable only for those listed, all others are neg:  Constitutional: neg  Cardiovascular: neg Ear/Nose/Throat: neg  Skin: neg Eyes: neg Respiratory: neg Gastroitestinal: neg  Hematology/Lymphatic: neg  Endocrine: neg Musculoskeletal:neg Allergy/Immunology: Environmental allergies Neurological: neg Psychiatric: neg Sleep : neg   ALLERGIES: Allergies  Allergen Reactions  . Levaquin [Levofloxacin] Anaphylaxis    Hives, dyspepsia, shortness of breath  . Penicillins Rash    HOME MEDICATIONS: Outpatient Prescriptions Prior to Visit  Medication Sig Dispense Refill  . EPINEPHrine (EPIPEN 2-PAK) 0.3 mg/0.3 mL IJ SOAJ injection Inject 0.3 mLs (0.3 mg total) into the muscle once as needed (for severe allergic reaction). CAll 911 immediately if you have to use this medicine 1 Device 1  . metFORMIN (GLUCOPHAGE-XR) 500 MG 24 hr tablet Take 500 mg by mouth daily.    . predniSONE (DELTASONE) 10 MG tablet 6 day dose pack take as directed 21 tablet 0  . Vitamin D, Ergocalciferol, (DRISDOL) 50000 UNITS CAPS capsule Take 50,000 Units by mouth. Takes every Tues and Friday    . carbamazepine (CARBATROL) 300 MG 12 hr capsule Take 1 capsule (300 mg total) by mouth 5 (five) times daily. 150 capsule 4   No facility-administered medications prior to visit.    PAST MEDICAL HISTORY: Past Medical History  Diagnosis Date  . Trigeminal neuralgia   . Trigeminal neuralgia   . Diabetes mellitus without complication (HCC)   . Trigeminal neuralgia     PAST SURGICAL HISTORY: Past Surgical History  Procedure Laterality Date  . Tubal ligation    . Wisdom tooth extraction  FAMILY HISTORY: Family History  Problem Relation Age of Onset  . Cancer Maternal Grandfather     BLADDER  . Cancer Father     PROSTATE  . Diabetes Father   . Diabetes Mother   . Mental illness Mother     ANXIETY  . Aneurysm Mother     Small cerebral aneurysm  . Thyroid disease Maternal Aunt   . Cancer  Maternal Aunt     LUNG  . Hypertension Maternal Uncle   . Diabetes Maternal Uncle   . Hypertension Maternal Aunt   . Diabetes Maternal Aunt   . Mental illness Maternal Aunt     SOCIAL HISTORY: Social History   Social History  . Marital Status: Married    Spouse Name: N/A  . Number of Children: 2  . Years of Education: 12   Occupational History  .  [employer]   Social History Main Topics  . Smoking status: Never Smoker   . Smokeless tobacco: Never Used  . Alcohol Use: No  . Drug Use: No  . Sexual Activity:    Partners: Male    Birth Control/ Protection: Other-see comments     Comment: BTL   Other Topics Concern  . Not on file   Social History Narrative     PHYSICAL EXAM  Filed Vitals:   02/04/16 0825  Height: 5\' 5"  (1.651 m)  Weight: 200 lb 12.8 oz (91.082 kg)   Body mass index is 33.41 kg/(m^2). General: well developed, obese female , seated, in no evident distress Head: head normocephalic and atraumatic. Oropharynx benign Neck: supple with no carotid bruits  Neurologic Exam Mental Status: Awake and fully alert. Oriented to place and time. Follows all commands. Speech and language normal.  Cranial Nerves: Pupils equal, briskly reactive to light. Extraocular movements full without nystagmus. Visual fields full to confrontation. Hearing intact and symmetric to finger snap. Facial sensation intact. Face, tongue, palate move normally and symmetrically. Neck flexion and extension normal.  Motor: Normal bulk and tone. Normal strength in all tested extremity muscles.No focal weakness Sensory.: intact to touch in the face and extremities.  Coordination: Rapid alternating movements normal in all extremities. Finger-to-nose and heel-to-shin performed accurately bilaterally. Gait and Station: Arises from chair without difficulty. Stance is normal. Gait demonstrates normal stride length and balance . Able to heel, toe and tandem walk without difficulty.   Reflexes: 2+ and symmetric. Toes downgoing.  DIAGNOSTIC DATA (LABS, IMAGING, TESTING) -  ASSESSMENT AND PLAN  40 y.o. year old female  has a past medical history of Trigeminal neuralgia; here to follow-up. [facility]. She is off her Carbatrol and her Lyrica now and has not had further facial pain.  PLAN Follow-up prn Nilda RiggsNancy Carolyn Soraiya Ahner, [facility], [facility], APRN  [facility] [address], [address] [place], [place] [address] [phone]

## 2016-02-04 NOTE — Patient Instructions (Signed)
Follow up prn only

## 2016-06-22 ENCOUNTER — Emergency Department (HOSPITAL_COMMUNITY): Payer: BLUE CROSS/BLUE SHIELD

## 2016-06-22 ENCOUNTER — Emergency Department (HOSPITAL_COMMUNITY)
Admission: EM | Admit: 2016-06-22 | Discharge: 2016-06-22 | Disposition: A | Discharge status: Home / Self Care | Payer: BLUE CROSS/BLUE SHIELD | Attending: Emergency Medicine | Admitting: Emergency Medicine

## 2016-06-22 ENCOUNTER — Encounter (HOSPITAL_COMMUNITY): Payer: Self-pay | Admitting: Emergency Medicine

## 2016-06-22 DIAGNOSIS — Y939 Activity, unspecified: Secondary | ICD-10-CM | POA: Insufficient documentation

## 2016-06-22 DIAGNOSIS — Z7984 Long term (current) use of oral hypoglycemic drugs: Secondary | ICD-10-CM | POA: Insufficient documentation

## 2016-06-22 DIAGNOSIS — S0300XA Dislocation of jaw, unspecified side, initial encounter: Secondary | ICD-10-CM

## 2016-06-22 DIAGNOSIS — Y929 Unspecified place or not applicable: Secondary | ICD-10-CM | POA: Diagnosis not present

## 2016-06-22 DIAGNOSIS — S0302XA Dislocation of jaw, left side, initial encounter: Secondary | ICD-10-CM | POA: Diagnosis not present

## 2016-06-22 DIAGNOSIS — R6884 Jaw pain: Secondary | ICD-10-CM | POA: Diagnosis not present

## 2016-06-22 DIAGNOSIS — Y999 Unspecified external cause status: Secondary | ICD-10-CM | POA: Insufficient documentation

## 2016-06-22 DIAGNOSIS — S0993XA Unspecified injury of face, initial encounter: Secondary | ICD-10-CM | POA: Diagnosis not present

## 2016-06-22 DIAGNOSIS — X509XXA Other and unspecified overexertion or strenuous movements or postures, initial encounter: Secondary | ICD-10-CM | POA: Insufficient documentation

## 2016-06-22 DIAGNOSIS — E119 Type 2 diabetes mellitus without complications: Secondary | ICD-10-CM | POA: Insufficient documentation

## 2016-06-22 MED ORDER — DIAZEPAM 5 MG/ML IJ SOLN
2.5000 mg | Freq: Once | INTRAMUSCULAR | Status: DC
  Filled 2016-06-22: qty 2

## 2016-06-22 MED ORDER — FENTANYL CITRATE (PF) 100 MCG/2ML IJ SOLN
100.0000 ug | Freq: Once | INTRAMUSCULAR | Status: AC
  Administered 2016-06-22: 100 ug via INTRAVENOUS
  Filled 2016-06-22: qty 2

## 2016-06-22 MED ORDER — DIAZEPAM 5 MG/ML IJ SOLN
2.5000 mg | Freq: Once | INTRAMUSCULAR | Status: AC
  Administered 2016-06-22: 2.5 mg via INTRAVENOUS
  Filled 2016-06-22: qty 2

## 2016-06-22 MED ORDER — NAPROXEN 250 MG PO TABS: 250.0000 mg | ORAL_TABLET | Freq: Two times a day (BID) | ORAL | 0 refills | Status: DC

## 2016-06-22 MED ORDER — FENTANYL CITRATE (PF) 100 MCG/2ML IJ SOLN
50.0000 ug | Freq: Once | INTRAMUSCULAR | Status: AC
  Administered 2016-06-22: 50 ug via INTRAVENOUS
  Filled 2016-06-22: qty 2

## 2016-06-22 NOTE — ED Triage Notes (Signed)
Pt sts unable to close mouth all the way after yawning this am; deformity noted

## 2016-06-22 NOTE — ED Provider Notes (Signed)
[facility]EMERGENCY DEPT Provider Note   CSN: [id number] Arrival date & time: 06/22/16  0945     History   Chief Complaint Chief Complaint  Patient presents with  . Jaw Pain    HPI Kelly Charles is a 40 y.o. female.  Kelly Charles is a 40 y.o. Female who presents to the ED complaining of left jaw pain after yawning. The patient reports she was yawning and felt a pop and now feels like her jaw is out of alignment. She reports when she closes her mouth she feels her teeth are not aligned. She complains of pain to her left jaw by her ear. She denies previous jaw dislocation. She's had nothing for treatment of her symptoms today. She denies any injury or trauma to her jaw. Patient denies fevers, sore throat, trouble swallowing, neck pain, abdominal pain, nausea, vomiting or rashes.   The history is provided by the patient. No language interpreter was used.    Past Medical History:  Diagnosis Date  . Diabetes mellitus without complication (HCC)   . Trigeminal neuralgia   . Trigeminal neuralgia   . Trigeminal neuralgia     Patient Active Problem List   Diagnosis Date Noted  . History of craniotomy 11/04/2015  . Encounter for screening for diabetes mellitus 09/30/2015  . Trigeminal neuralgia 02/24/2012    Past Surgical History:  Procedure Laterality Date  . CEREBRAL MICROVASCULAR DECOMPRESSION Right 10/01/15   [facility]  Dr. Tempie Donning  . TUBAL LIGATION    . WISDOM TOOTH EXTRACTION      OB History    Gravida Para Term Preterm AB Living   2 2 2     2    SAB TAB Ectopic Multiple Live Births                   Home Medications    Prior to Admission medications   Medication Sig Start Date End Date Taking? Authorizing Provider  Cholecalciferol (VITAMIN D-1000 MAX ST) 1000 units tablet Take 5,000 Units by mouth.     Historical Provider, MD  EPINEPHrine (EPIPEN 2-PAK) 0.3 mg/0.3 mL IJ SOAJ injection Inject 0.3 mLs (0.3 mg total) into the muscle once as needed  (for severe allergic reaction). CAll 911 immediately if you have to use this medicine 01/13/15   Joni Reining Pisciotta, PA-C  metFORMIN (GLUCOPHAGE) 1000 MG tablet Take 1,000 mg by mouth daily with breakfast.     Historical Provider, MD  naproxen (NAPROSYN) 250 MG tablet Take 1 tablet (250 mg total) by mouth 2 (two) times daily with a meal. 06/22/16   Everlene Farrier, PA-C    Family History Family History  Problem Relation Age of Onset  . Cancer Maternal Grandfather     BLADDER  . Cancer Father     PROSTATE  . Diabetes Father   . Diabetes Mother   . Mental illness Mother     ANXIETY  . Aneurysm Mother     Small cerebral aneurysm  . Thyroid disease Maternal Aunt   . Cancer Maternal Aunt     LUNG  . Hypertension Maternal Uncle   . Diabetes Maternal Uncle   . Hypertension Maternal Aunt   . Diabetes Maternal Aunt   . Mental illness Maternal Aunt     Social History Social History  Substance Use Topics  . Smoking status: Never Smoker  . Smokeless tobacco: Never Used  . Alcohol use No     Allergies   Levaquin [levofloxacin] and Penicillins   Review of  Systems Review of Systems  Constitutional: Negative for chills and fever.  HENT: Negative for congestion, sore throat and trouble swallowing.   Eyes: Negative for visual disturbance.  Respiratory: Negative for cough and shortness of breath.   Cardiovascular: Negative for chest pain.  Gastrointestinal: Negative for abdominal pain, diarrhea, nausea and vomiting.  Genitourinary: Negative for dysuria.  Musculoskeletal: Positive for arthralgias. Negative for neck pain.       Jaw pain   Skin: Negative for rash.  Neurological: Negative for headaches.     Physical Exam Updated Vital Signs BP 121/79   Pulse 84   Temp 98.5 F (36.9 C) (Oral)   Resp 17   Ht 5\' 6"  (1.676 m)   Wt 93 kg   SpO2 98%   BMI 33.09 kg/m   Physical Exam  Constitutional: She appears well-developed and well-nourished. No distress.  Nontoxic appearing.    HENT:  Head: Normocephalic and atraumatic.  Right Ear: External ear normal.  Left Ear: External ear normal.  Tenderness overlying her left TMJ area. Patient's teeth are slightly out of alignment. Jaw appears to be shifted towards the right. Patient is able to open her mouth slightly and is able to speak.  Eyes: Conjunctivae are normal. Pupils are equal, round, and reactive to light. Right eye exhibits no discharge. Left eye exhibits no discharge.  Neck: Neck supple. No tracheal deviation present.  Cardiovascular: Normal rate, regular rhythm, normal heart sounds and intact distal pulses.   Pulmonary/Chest: Effort normal and breath sounds normal. No respiratory distress.  Abdominal: Soft. There is no tenderness.  Musculoskeletal:  Patient is spontaneously moving all extremities in a coordinated fashion exhibiting good strength.   Lymphadenopathy:    She has no cervical adenopathy.  Neurological: She is alert. Coordination normal.  Skin: Skin is warm and dry. No rash noted. She is not diaphoretic. No erythema. No pallor.  Psychiatric: She has a normal mood and affect. Her behavior is normal.  Nursing note and vitals reviewed.    ED Treatments / Results  Labs (all labs ordered are listed, but only abnormal results are displayed) Labs Reviewed - No data to display  EKG  EKG Interpretation None       Radiology Ct Maxillofacial Wo Contrast  Result Date: 06/22/2016 CLINICAL DATA:  Patient yawned and felt LEFT jaw dislocate. No history of dislocation prior. EXAM: CT MAXILLOFACIAL WITHOUT CONTRAST TECHNIQUE: Multidetector CT imaging of the maxillofacial structures was performed. Multiplanar CT image reconstructions were also generated. A small metallic BB was placed on the right temple in order to reliably differentiate right from left. COMPARISON:  CT head 06/07/2009. FINDINGS: Osseous: No fracture. The LEFT mandibular condyle is anteriorly displaced on the TMJ fossa. No bony fragment is  seen which might prevent reduction of the LEFT TMJ. Orbits: Negative Sinuses: Negative Soft tissues: Unremarkable.  Poor dentition. Limited intracranial: There has been a previous RIGHT suboccipital craniectomy, for presumed treatment of trigeminal neuralgia. No acute or focal intracranial abnormality is detected. IMPRESSION: LEFT TMJ dislocation. No fracture or bony fragment is seen which might prevent reduction. Electronically Signed   By: Elsie Stain M.D.   On: 06/22/2016 11:22    Procedures Reduction of dislocation Date/Time: 06/22/2016 12:47 PM Performed by: Everlene Farrier Authorized by: Everlene Farrier  Consent: Verbal consent obtained. Risks and benefits: risks, benefits and alternatives were discussed Consent given by: patient Patient understanding: patient states understanding of the procedure being performed Patient consent: the patient's understanding of the procedure matches consent given Procedure  consent: procedure consent matches procedure scheduled Relevant documents: relevant documents present and verified Test results: test results available and properly labeled Site marked: the operative site was marked Imaging studies: imaging studies available Required items: required blood products, implants, devices, and special equipment available Patient identity confirmed: verbally with patient Time out: Immediately prior to procedure a "time out" was called to verify the correct patient, procedure, equipment, support staff and site/side marked as required. Local anesthesia used: no  Anesthesia: Local anesthesia used: no  Sedation: Patient sedated: no Patient tolerance: Patient tolerated the procedure well with no immediate complications Comments: Reduction of jaw dislocation.     (including critical care time)  Medications Ordered in ED Medications  diazepam (VALIUM) injection 2.5 mg (2.5 mg Intravenous Given 06/22/16 1046)  fentaNYL (SUBLIMAZE) injection 50 mcg (50 mcg  Intravenous Given 06/22/16 1041)  fentaNYL (SUBLIMAZE) injection 100 mcg (100 mcg Intravenous Given 06/22/16 1239)     Initial Impression / Assessment and Plan / ED Course  I have reviewed the triage vital signs and the nursing notes.  Pertinent labs & imaging results that were available during my care of the patient were reviewed by me and considered in my medical decision making (see chart for details).  Clinical Course    Patient presented to the emergency department TMJ dislocation after yawning. No trauma to her jaw. This is never happened to her previously. On exam she has dislocation of her left TMJ joint. CT revealed dislocation of her left TMJ joint. She received Valium and ethanol I was able to successfully reduce her TMJ joint with ease. Patient reports feeling back to normal. Her teeth are in alignment. I discussed TMJ dislocation precautions. I encouraged her to avoid yawning or opening her mouth widely. I encouraged her to follow-up with dentist Dr. Norma FredricksonFarr list for recheck. I discussed return precautions. I advised the patient to follow-up with their primary care provider this week. I advised the patient to return to the emergency department with new or worsening symptoms or new concerns. The patient verbalized understanding and agreement with plan.    Final Clinical Impressions(s) / ED Diagnoses   Final diagnoses:  Jaw pain  TMJ dislocation, initial encounter    New Prescriptions Discharge Medication List as of 06/22/2016  1:10 PM    START taking these medications   Details  naproxen (NAPROSYN) 250 MG tablet Take 1 tablet (250 mg total) by mouth 2 (two) times daily with a meal., Starting Wed 06/22/2016, Print         Everlene FarrierWilliam Alexandrya Chim, PA-C 06/22/16 1740    Cathren LaineKevin Steinl, MD 06/25/16 [phone]

## 2016-06-22 NOTE — ED Notes (Signed)
PA at bedside.

## 2016-06-22 NOTE — ED Notes (Signed)
Pt aaox3 handling secreations , able to speak ,  Mom states alittle slurring of words

## 2016-06-28 ENCOUNTER — Encounter: Payer: Self-pay | Admitting: Nurse Practitioner

## 2016-06-28 ENCOUNTER — Encounter (INDEPENDENT_AMBULATORY_CARE_PROVIDER_SITE_OTHER): Payer: Self-pay | Admitting: Nurse Practitioner

## 2016-06-28 DIAGNOSIS — Z0289 Encounter for other administrative examinations: Secondary | ICD-10-CM

## 2016-06-28 NOTE — Progress Notes (Signed)
Pt here for appt for L dislocation of jaw f/u.  She is doing fine.  Looking at her ED note, she was to f/u with her pcp and dentist.  She has no dentist at this time. We have seen her for her trigeminal neuralgia R side.  She is doing well, she did mention that her surgical incision still tender, I relayed to have her call surgeon or have pcp evaluate.  She will call us back as needed.  Co pay to be returned.    This encounter was created in error - please disregard.

## 2016-09-01 DIAGNOSIS — Z6834 Body mass index (BMI) 34.0-34.9, adult: Secondary | ICD-10-CM | POA: Diagnosis not present

## 2016-09-01 DIAGNOSIS — Z0000 Encounter for general adult medical examination without abnormal findings: Secondary | ICD-10-CM | POA: Diagnosis not present

## 2016-09-01 DIAGNOSIS — F418 Other specified anxiety disorders: Secondary | ICD-10-CM | POA: Diagnosis not present

## 2016-09-01 DIAGNOSIS — M25561 Pain in right knee: Secondary | ICD-10-CM | POA: Diagnosis not present

## 2016-09-01 DIAGNOSIS — Z0289 Encounter for other administrative examinations: Secondary | ICD-10-CM | POA: Diagnosis not present

## 2016-09-12 DIAGNOSIS — M25561 Pain in right knee: Secondary | ICD-10-CM | POA: Diagnosis not present

## 2016-09-14 DIAGNOSIS — M25561 Pain in right knee: Secondary | ICD-10-CM | POA: Diagnosis not present

## 2016-09-19 DIAGNOSIS — F411 Generalized anxiety disorder: Secondary | ICD-10-CM | POA: Diagnosis not present

## 2016-09-19 DIAGNOSIS — M25561 Pain in right knee: Secondary | ICD-10-CM | POA: Diagnosis not present

## 2016-09-21 DIAGNOSIS — M25561 Pain in right knee: Secondary | ICD-10-CM | POA: Diagnosis not present

## 2016-09-26 DIAGNOSIS — M25561 Pain in right knee: Secondary | ICD-10-CM | POA: Diagnosis not present

## 2016-10-03 DIAGNOSIS — M25561 Pain in right knee: Secondary | ICD-10-CM | POA: Diagnosis not present

## 2016-10-03 DIAGNOSIS — F411 Generalized anxiety disorder: Secondary | ICD-10-CM | POA: Diagnosis not present

## 2016-10-04 DIAGNOSIS — F411 Generalized anxiety disorder: Secondary | ICD-10-CM | POA: Diagnosis not present

## 2016-10-05 DIAGNOSIS — M25561 Pain in right knee: Secondary | ICD-10-CM | POA: Diagnosis not present

## 2016-10-11 DIAGNOSIS — M25561 Pain in right knee: Secondary | ICD-10-CM | POA: Diagnosis not present

## 2016-10-13 DIAGNOSIS — M25561 Pain in right knee: Secondary | ICD-10-CM | POA: Diagnosis not present

## 2016-10-19 DIAGNOSIS — F411 Generalized anxiety disorder: Secondary | ICD-10-CM | POA: Diagnosis not present

## 2016-10-19 DIAGNOSIS — F418 Other specified anxiety disorders: Secondary | ICD-10-CM | POA: Diagnosis not present

## 2016-10-19 DIAGNOSIS — Z6834 Body mass index (BMI) 34.0-34.9, adult: Secondary | ICD-10-CM | POA: Diagnosis not present

## 2016-10-19 DIAGNOSIS — G4700 Insomnia, unspecified: Secondary | ICD-10-CM | POA: Diagnosis not present

## 2016-10-19 DIAGNOSIS — M25561 Pain in right knee: Secondary | ICD-10-CM | POA: Diagnosis not present

## 2016-10-20 DIAGNOSIS — F411 Generalized anxiety disorder: Secondary | ICD-10-CM | POA: Diagnosis not present

## 2016-11-01 DIAGNOSIS — F411 Generalized anxiety disorder: Secondary | ICD-10-CM | POA: Diagnosis not present

## 2016-11-02 DIAGNOSIS — F411 Generalized anxiety disorder: Secondary | ICD-10-CM | POA: Diagnosis not present

## 2016-11-15 DIAGNOSIS — F411 Generalized anxiety disorder: Secondary | ICD-10-CM | POA: Diagnosis not present

## 2016-11-16 DIAGNOSIS — F411 Generalized anxiety disorder: Secondary | ICD-10-CM | POA: Diagnosis not present

## 2016-11-29 DIAGNOSIS — F411 Generalized anxiety disorder: Secondary | ICD-10-CM | POA: Diagnosis not present

## 2016-11-30 DIAGNOSIS — F411 Generalized anxiety disorder: Secondary | ICD-10-CM | POA: Diagnosis not present

## 2016-12-13 DIAGNOSIS — F411 Generalized anxiety disorder: Secondary | ICD-10-CM | POA: Diagnosis not present

## 2016-12-14 DIAGNOSIS — F411 Generalized anxiety disorder: Secondary | ICD-10-CM | POA: Diagnosis not present

## 2016-12-14 DIAGNOSIS — R7303 Prediabetes: Secondary | ICD-10-CM | POA: Diagnosis not present

## 2016-12-14 DIAGNOSIS — F418 Other specified anxiety disorders: Secondary | ICD-10-CM | POA: Diagnosis not present

## 2016-12-14 DIAGNOSIS — G4700 Insomnia, unspecified: Secondary | ICD-10-CM | POA: Diagnosis not present

## 2016-12-14 DIAGNOSIS — Z79899 Other long term (current) drug therapy: Secondary | ICD-10-CM | POA: Diagnosis not present

## 2016-12-28 DIAGNOSIS — F411 Generalized anxiety disorder: Secondary | ICD-10-CM | POA: Diagnosis not present

## 2017-01-09 DIAGNOSIS — F411 Generalized anxiety disorder: Secondary | ICD-10-CM | POA: Diagnosis not present

## 2017-01-10 DIAGNOSIS — F411 Generalized anxiety disorder: Secondary | ICD-10-CM | POA: Diagnosis not present

## 2017-01-23 DIAGNOSIS — F411 Generalized anxiety disorder: Secondary | ICD-10-CM | POA: Diagnosis not present

## 2017-01-24 DIAGNOSIS — F411 Generalized anxiety disorder: Secondary | ICD-10-CM | POA: Diagnosis not present

## 2017-02-06 DIAGNOSIS — F411 Generalized anxiety disorder: Secondary | ICD-10-CM | POA: Diagnosis not present

## 2017-02-07 DIAGNOSIS — F411 Generalized anxiety disorder: Secondary | ICD-10-CM | POA: Diagnosis not present

## 2017-02-08 DIAGNOSIS — G4700 Insomnia, unspecified: Secondary | ICD-10-CM | POA: Diagnosis not present

## 2017-02-08 DIAGNOSIS — F418 Other specified anxiety disorders: Secondary | ICD-10-CM | POA: Diagnosis not present

## 2017-02-20 DIAGNOSIS — F411 Generalized anxiety disorder: Secondary | ICD-10-CM | POA: Diagnosis not present

## 2017-02-21 DIAGNOSIS — F411 Generalized anxiety disorder: Secondary | ICD-10-CM | POA: Diagnosis not present

## 2017-03-07 DIAGNOSIS — F411 Generalized anxiety disorder: Secondary | ICD-10-CM | POA: Diagnosis not present

## 2017-03-08 DIAGNOSIS — F411 Generalized anxiety disorder: Secondary | ICD-10-CM | POA: Diagnosis not present

## 2017-03-16 DIAGNOSIS — J020 Streptococcal pharyngitis: Secondary | ICD-10-CM | POA: Diagnosis not present

## 2017-03-20 DIAGNOSIS — F411 Generalized anxiety disorder: Secondary | ICD-10-CM | POA: Diagnosis not present

## 2017-03-21 DIAGNOSIS — F411 Generalized anxiety disorder: Secondary | ICD-10-CM | POA: Diagnosis not present

## 2017-04-03 DIAGNOSIS — F411 Generalized anxiety disorder: Secondary | ICD-10-CM | POA: Diagnosis not present

## 2017-04-04 DIAGNOSIS — F411 Generalized anxiety disorder: Secondary | ICD-10-CM | POA: Diagnosis not present

## 2017-04-24 DIAGNOSIS — F411 Generalized anxiety disorder: Secondary | ICD-10-CM | POA: Diagnosis not present

## 2017-04-25 DIAGNOSIS — F411 Generalized anxiety disorder: Secondary | ICD-10-CM | POA: Diagnosis not present

## 2017-05-08 DIAGNOSIS — F411 Generalized anxiety disorder: Secondary | ICD-10-CM | POA: Diagnosis not present

## 2017-05-09 DIAGNOSIS — F411 Generalized anxiety disorder: Secondary | ICD-10-CM | POA: Diagnosis not present

## 2017-05-15 DIAGNOSIS — Z6833 Body mass index (BMI) 33.0-33.9, adult: Secondary | ICD-10-CM | POA: Diagnosis not present

## 2017-05-15 DIAGNOSIS — N760 Acute vaginitis: Secondary | ICD-10-CM | POA: Diagnosis not present

## 2017-05-15 DIAGNOSIS — R7303 Prediabetes: Secondary | ICD-10-CM | POA: Diagnosis not present

## 2017-05-22 DIAGNOSIS — F411 Generalized anxiety disorder: Secondary | ICD-10-CM | POA: Diagnosis not present

## 2017-05-23 DIAGNOSIS — F411 Generalized anxiety disorder: Secondary | ICD-10-CM | POA: Diagnosis not present

## 2017-05-29 DIAGNOSIS — F411 Generalized anxiety disorder: Secondary | ICD-10-CM | POA: Diagnosis not present

## 2017-05-30 DIAGNOSIS — F411 Generalized anxiety disorder: Secondary | ICD-10-CM | POA: Diagnosis not present

## 2017-06-05 DIAGNOSIS — F411 Generalized anxiety disorder: Secondary | ICD-10-CM | POA: Diagnosis not present

## 2017-06-06 DIAGNOSIS — F411 Generalized anxiety disorder: Secondary | ICD-10-CM | POA: Diagnosis not present

## 2017-06-21 DIAGNOSIS — F411 Generalized anxiety disorder: Secondary | ICD-10-CM | POA: Diagnosis not present

## 2017-06-22 DIAGNOSIS — F411 Generalized anxiety disorder: Secondary | ICD-10-CM | POA: Diagnosis not present

## 2017-07-03 DIAGNOSIS — F411 Generalized anxiety disorder: Secondary | ICD-10-CM | POA: Diagnosis not present

## 2017-07-04 DIAGNOSIS — F411 Generalized anxiety disorder: Secondary | ICD-10-CM | POA: Diagnosis not present

## 2017-07-13 ENCOUNTER — Other Ambulatory Visit: Payer: Self-pay | Admitting: Internal Medicine

## 2017-07-13 DIAGNOSIS — Z1231 Encounter for screening mammogram for malignant neoplasm of breast: Secondary | ICD-10-CM

## 2017-07-17 DIAGNOSIS — F411 Generalized anxiety disorder: Secondary | ICD-10-CM | POA: Diagnosis not present

## 2017-07-18 DIAGNOSIS — F411 Generalized anxiety disorder: Secondary | ICD-10-CM | POA: Diagnosis not present

## 2017-07-24 DIAGNOSIS — F411 Generalized anxiety disorder: Secondary | ICD-10-CM | POA: Diagnosis not present

## 2017-07-25 DIAGNOSIS — F411 Generalized anxiety disorder: Secondary | ICD-10-CM | POA: Diagnosis not present

## 2017-07-31 DIAGNOSIS — F411 Generalized anxiety disorder: Secondary | ICD-10-CM | POA: Diagnosis not present

## 2017-08-01 DIAGNOSIS — F411 Generalized anxiety disorder: Secondary | ICD-10-CM | POA: Diagnosis not present

## 2017-08-14 DIAGNOSIS — F411 Generalized anxiety disorder: Secondary | ICD-10-CM | POA: Diagnosis not present

## 2017-08-15 ENCOUNTER — Ambulatory Visit
Admission: RE | Admit: 2017-08-15 | Discharge: 2017-08-15 | Disposition: A | Discharge status: Home / Self Care | Payer: BLUE CROSS/BLUE SHIELD | Source: Ambulatory Visit | Attending: Internal Medicine | Admitting: Internal Medicine

## 2017-08-15 DIAGNOSIS — Z1231 Encounter for screening mammogram for malignant neoplasm of breast: Secondary | ICD-10-CM | POA: Diagnosis not present

## 2017-08-15 DIAGNOSIS — F411 Generalized anxiety disorder: Secondary | ICD-10-CM | POA: Diagnosis not present

## 2017-08-28 DIAGNOSIS — F411 Generalized anxiety disorder: Secondary | ICD-10-CM | POA: Diagnosis not present

## 2017-08-29 DIAGNOSIS — F411 Generalized anxiety disorder: Secondary | ICD-10-CM | POA: Diagnosis not present

## 2017-09-05 DIAGNOSIS — F411 Generalized anxiety disorder: Secondary | ICD-10-CM | POA: Diagnosis not present

## 2017-09-06 DIAGNOSIS — F411 Generalized anxiety disorder: Secondary | ICD-10-CM | POA: Diagnosis not present

## 2017-09-11 DIAGNOSIS — F411 Generalized anxiety disorder: Secondary | ICD-10-CM | POA: Diagnosis not present

## 2017-09-12 DIAGNOSIS — F411 Generalized anxiety disorder: Secondary | ICD-10-CM | POA: Diagnosis not present

## 2017-09-19 DIAGNOSIS — F411 Generalized anxiety disorder: Secondary | ICD-10-CM | POA: Diagnosis not present

## 2017-09-20 DIAGNOSIS — F411 Generalized anxiety disorder: Secondary | ICD-10-CM | POA: Diagnosis not present

## 2017-09-26 DIAGNOSIS — F411 Generalized anxiety disorder: Secondary | ICD-10-CM | POA: Diagnosis not present

## 2017-09-27 DIAGNOSIS — F411 Generalized anxiety disorder: Secondary | ICD-10-CM | POA: Diagnosis not present

## 2017-10-19 DIAGNOSIS — Z01419 Encounter for gynecological examination (general) (routine) without abnormal findings: Secondary | ICD-10-CM | POA: Diagnosis not present

## 2017-10-19 DIAGNOSIS — Z0000 Encounter for general adult medical examination without abnormal findings: Secondary | ICD-10-CM | POA: Diagnosis not present

## 2017-10-19 DIAGNOSIS — Z1212 Encounter for screening for malignant neoplasm of rectum: Secondary | ICD-10-CM | POA: Diagnosis not present

## 2017-10-19 DIAGNOSIS — D649 Anemia, unspecified: Secondary | ICD-10-CM | POA: Diagnosis not present

## 2017-11-08 DIAGNOSIS — F411 Generalized anxiety disorder: Secondary | ICD-10-CM | POA: Diagnosis not present

## 2017-11-15 DIAGNOSIS — F411 Generalized anxiety disorder: Secondary | ICD-10-CM | POA: Diagnosis not present

## 2017-11-21 DIAGNOSIS — F411 Generalized anxiety disorder: Secondary | ICD-10-CM | POA: Diagnosis not present

## 2017-12-04 DIAGNOSIS — F411 Generalized anxiety disorder: Secondary | ICD-10-CM | POA: Diagnosis not present

## 2017-12-11 DIAGNOSIS — F411 Generalized anxiety disorder: Secondary | ICD-10-CM | POA: Diagnosis not present

## 2018-01-01 DIAGNOSIS — F411 Generalized anxiety disorder: Secondary | ICD-10-CM | POA: Diagnosis not present

## 2018-01-15 DIAGNOSIS — F411 Generalized anxiety disorder: Secondary | ICD-10-CM | POA: Diagnosis not present

## 2018-01-22 DIAGNOSIS — F411 Generalized anxiety disorder: Secondary | ICD-10-CM | POA: Diagnosis not present

## 2018-01-29 DIAGNOSIS — F411 Generalized anxiety disorder: Secondary | ICD-10-CM | POA: Diagnosis not present

## 2018-02-12 DIAGNOSIS — F411 Generalized anxiety disorder: Secondary | ICD-10-CM | POA: Diagnosis not present

## 2018-02-21 DIAGNOSIS — F411 Generalized anxiety disorder: Secondary | ICD-10-CM | POA: Diagnosis not present

## 2018-02-26 DIAGNOSIS — F411 Generalized anxiety disorder: Secondary | ICD-10-CM | POA: Diagnosis not present

## 2018-03-07 DIAGNOSIS — F411 Generalized anxiety disorder: Secondary | ICD-10-CM | POA: Diagnosis not present

## 2018-03-12 DIAGNOSIS — F411 Generalized anxiety disorder: Secondary | ICD-10-CM | POA: Diagnosis not present

## 2018-03-26 DIAGNOSIS — F411 Generalized anxiety disorder: Secondary | ICD-10-CM | POA: Diagnosis not present

## 2018-04-18 DIAGNOSIS — Z1389 Encounter for screening for other disorder: Secondary | ICD-10-CM | POA: Diagnosis not present

## 2018-04-18 DIAGNOSIS — J309 Allergic rhinitis, unspecified: Secondary | ICD-10-CM | POA: Diagnosis not present

## 2018-04-18 DIAGNOSIS — R946 Abnormal results of thyroid function studies: Secondary | ICD-10-CM | POA: Diagnosis not present

## 2018-04-18 DIAGNOSIS — R7309 Other abnormal glucose: Secondary | ICD-10-CM | POA: Diagnosis not present

## 2018-04-18 DIAGNOSIS — M7061 Trochanteric bursitis, right hip: Secondary | ICD-10-CM | POA: Diagnosis not present

## 2018-07-12 ENCOUNTER — Other Ambulatory Visit: Payer: Self-pay | Admitting: Internal Medicine

## 2018-07-12 DIAGNOSIS — Z1231 Encounter for screening mammogram for malignant neoplasm of breast: Secondary | ICD-10-CM

## 2018-08-16 ENCOUNTER — Ambulatory Visit
Admission: RE | Admit: 2018-08-16 | Discharge: 2018-08-16 | Disposition: A | Discharge status: Home / Self Care | Payer: BLUE CROSS/BLUE SHIELD | Source: Ambulatory Visit | Attending: Internal Medicine | Admitting: Internal Medicine

## 2018-08-16 DIAGNOSIS — Z1231 Encounter for screening mammogram for malignant neoplasm of breast: Secondary | ICD-10-CM | POA: Diagnosis not present

## 2018-09-17 ENCOUNTER — Other Ambulatory Visit: Payer: Self-pay | Admitting: Internal Medicine

## 2018-09-17 ENCOUNTER — Encounter: Payer: Self-pay | Admitting: Internal Medicine

## 2018-09-17 NOTE — Telephone Encounter (Signed)
Eszopiclone 3 mg refill 

## 2018-09-25 ENCOUNTER — Other Ambulatory Visit: Payer: Self-pay | Admitting: Internal Medicine

## 2018-10-29 ENCOUNTER — Other Ambulatory Visit: Payer: Self-pay | Admitting: Internal Medicine

## 2018-11-21 ENCOUNTER — Ambulatory Visit (INDEPENDENT_AMBULATORY_CARE_PROVIDER_SITE_OTHER): Payer: BLUE CROSS/BLUE SHIELD | Admitting: Internal Medicine

## 2018-11-21 ENCOUNTER — Other Ambulatory Visit (HOSPITAL_COMMUNITY)
Admission: RE | Admit: 2018-11-21 | Discharge: 2018-11-21 | Disposition: A | Discharge status: Home / Self Care | Payer: BLUE CROSS/BLUE SHIELD | Source: Ambulatory Visit | Attending: Internal Medicine | Admitting: Internal Medicine

## 2018-11-21 ENCOUNTER — Encounter: Payer: Self-pay | Admitting: Internal Medicine

## 2018-11-21 VITALS — BP 116/78 | HR 89 | Temp 98.0°F | Ht 65.8 in | Wt 212.8 lb

## 2018-11-21 DIAGNOSIS — Z0000 Encounter for general adult medical examination without abnormal findings: Secondary | ICD-10-CM

## 2018-11-21 DIAGNOSIS — R64 Cachexia: Secondary | ICD-10-CM | POA: Diagnosis not present

## 2018-11-21 DIAGNOSIS — Z01419 Encounter for gynecological examination (general) (routine) without abnormal findings: Secondary | ICD-10-CM

## 2018-11-21 DIAGNOSIS — Z8619 Personal history of other infectious and parasitic diseases: Secondary | ICD-10-CM

## 2018-11-21 DIAGNOSIS — Z1212 Encounter for screening for malignant neoplasm of rectum: Secondary | ICD-10-CM | POA: Diagnosis not present

## 2018-11-21 LAB — POCT URINALYSIS DIPSTICK
Bilirubin, UA: NEGATIVE
Blood, UA: NEGATIVE
Glucose, UA: NEGATIVE
KETONES UA: NEGATIVE
Leukocytes, UA: NEGATIVE
NITRITE UA: NEGATIVE
PH UA: 6 (ref 5.0–8.0)
PROTEIN UA: NEGATIVE
Spec Grav, UA: 1.01 (ref 1.010–1.025)
UROBILINOGEN UA: 0.2 U/dL

## 2018-11-21 LAB — POC HEMOCCULT BLD/STL (OFFICE/1-CARD/DIAGNOSTIC)
Card #1 Date: 3302020
FECAL OCCULT BLD: NEGATIVE

## 2018-11-21 NOTE — Progress Notes (Signed)
Subjective:     Patient ID: Kelly Charles , female    DOB: [birth year] , 43 y.o.   MRN: [id number]   Chief Complaint  Patient presents with  . Annual Exam    HPI  She is here today for a full physical examination.  Her last pap smear was 2019. This was HPV positive.     Past Medical History:  Diagnosis Date  . Diabetes mellitus without complication ([place])   . Trigeminal neuralgia   . Trigeminal neuralgia   . Trigeminal neuralgia      Family History  Problem Relation Age of Onset  . Cancer Maternal Grandfather        BLADDER  . Cancer Father        PROSTATE  . Diabetes Father   . Diabetes Mother   . Mental illness Mother        ANXIETY  . Aneurysm Mother        Small cerebral aneurysm  . Thyroid disease Maternal Aunt   . Cancer Maternal Aunt        LUNG  . Hypertension Maternal Uncle   . Diabetes Maternal Uncle   . Hypertension Maternal Aunt   . Diabetes Maternal Aunt   . Mental illness Maternal Aunt   . Breast cancer Maternal Aunt      Current Outpatient Medications:  .  Cholecalciferol (VITAMIN D-1000 MAX ST) 1000 units tablet, Take 5,000 Units by mouth. , Disp: , Rfl:  .  DULoxetine (CYMBALTA) 60 MG capsule, TAKE ONE CAPSULE BY MOUTH EVERY DAY, Disp: 90 capsule, Rfl: 1 .  EPINEPHrine (EPIPEN 2-PAK) 0.3 mg/0.3 mL IJ SOAJ injection, Inject 0.3 mLs (0.3 mg total) into the muscle once as needed (for severe allergic reaction). CAll 911 immediately if you have to use this medicine, Disp: 1 Device, Rfl: 1 .  Eszopiclone 3 MG TABS, TAKE 1 TABLET AT BEDTIME AS NEEDED FOR SLEEP, Disp: 30 tablet, Rfl: 2 .  folic acid (FOLVITE) 1 MG tablet, TAKE 1 TABLET EVERY DAY, Disp: 90 tablet, Rfl: 1 .  metFORMIN (GLUCOPHAGE) 1000 MG tablet, Take 1,000 mg by mouth daily with breakfast. , Disp: , Rfl:    Allergies  Allergen Reactions  . Levaquin [Levofloxacin] Anaphylaxis    Hives, dyspepsia, shortness of breath  . Penicillins Rash     Last LMP was Patient's last  menstrual period was 11/14/2018..  Negative for: breast discharge, breast lump(s), breast pain and breast self exam. Associated symptoms include abnormal vaginal bleeding. Pertinent negatives include abnormal bleeding (hematology), anxiety, decreased libido, depression, difficulty falling sleep, dyspareunia, history of infertility, nocturia, sexual dysfunction, sleep disturbances, urinary incontinence, urinary urgency, vaginal discharge and vaginal itching. Diet regular.The patient states her exercise level is  NEGLIGIBLE.   . The patient's tobacco use is:  Social History   Tobacco Use  Smoking Status Never Smoker  Smokeless Tobacco Never Used  . She has been exposed to passive smoke. The patient's alcohol use is:  Social History   Substance and Sexual Activity  Alcohol Use No  . Additional information: Last pap jAN 2019, next one scheduled for today.   Review of Systems  Constitutional: Negative.   HENT: Negative.   Eyes: Negative.   Respiratory: Negative.   Cardiovascular: Negative.   Gastrointestinal: Negative.   Endocrine: Negative.   Genitourinary: Negative.   Musculoskeletal: Negative.   Skin: Negative.   Allergic/Immunologic: Negative.   Neurological: Negative.   Hematological: Negative.   Psychiatric/Behavioral: Negative.  Today's Vitals   11/21/18 0958  BP: 116/78  Pulse: 89  Temp: 98 F (36.7 C)  TempSrc: Oral  Weight: 212 lb 12.8 oz (96.5 kg)  Height: 5' 5.8" (1.671 m)   Body mass index is 34.56 kg/m.   Objective:  Physical Exam Vitals signs and nursing note reviewed.  Constitutional:      Appearance: Normal appearance. She is obese.  HENT:     Head: Normocephalic and atraumatic.     Right Ear: Tympanic membrane, ear canal and external ear normal.     Left Ear: Tympanic membrane, ear canal and external ear normal.     Nose: Nose normal.     Mouth/Throat:     Mouth: Mucous membranes are dry.     Pharynx: Oropharynx is clear.  Eyes:      Extraocular Movements: Extraocular movements intact.     Conjunctiva/sclera: Conjunctivae normal.     Pupils: Pupils are equal, round, and reactive to light.  Neck:     Musculoskeletal: Normal range of motion and neck supple.  Cardiovascular:     Rate and Rhythm: Normal rate and regular rhythm.     Pulses: Normal pulses.     Heart sounds: Normal heart sounds.  Pulmonary:     Effort: Pulmonary effort is normal.     Breath sounds: Normal breath sounds.  Chest:     Breasts:        Right: Normal. No swelling, bleeding, inverted nipple, mass, nipple discharge or skin change.        Left: Normal. No swelling, bleeding, inverted nipple, mass, nipple discharge or skin change.  Abdominal:     General: Bowel sounds are normal.     Palpations: Abdomen is soft.  Genitourinary:    General: Normal vulva.     Exam position: Lithotomy position.     Vagina: Normal.     Cervix: Normal and dilated.     Uterus: Normal.      Adnexa: Right adnexa normal and left adnexa normal.     Rectum: Normal. Guaiac result negative.  Musculoskeletal: Normal range of motion.     Right lower leg: No edema.  Skin:    General: Skin is warm and dry.     Capillary Refill: Capillary refill takes less than 2 seconds.  Neurological:     General: No focal deficit present.     Mental Status: She is alert and oriented to person, place, and time.  Psychiatric:        Mood and Affect: Mood normal.        Behavior: Behavior normal.        Thought Content: Thought content normal.         Assessment And Plan:     1. Routine general medical examination at health care facility  A full exam was performed. Importance of monthly self breast exams was discussed with the patient. PATIENT HAS BEEN ADVISED TO GET 30-45 MINUTES REGULAR EXERCISE NO LESS THAN FOUR TO FIVE DAYS PER WEEK - BOTH WEIGHTBEARING EXERCISES AND AEROBIC ARE RECOMMENDED.  SHE IS ADVISED TO FOLLOW A HEALTHY DIET WITH AT LEAST SIX FRUITS/VEGGIES PER DAY,  DECREASE INTAKE OF RED MEAT, AND TO INCREASE FISH INTAKE TO TWO DAYS PER WEEK.  MEATS/FISH SHOULD NOT BE FRIED, BAKED OR BROILED IS PREFERABLE.  I SUGGEST WEARING SPF 50 SUNSCREEN ON EXPOSED PARTS AND ESPECIALLY WHEN IN THE DIRECT SUNLIGHT FOR AN EXTENDED PERIOD OF TIME.  PLEASE AVOID FAST FOOD RESTAURANTS AND INCREASE YOUR  WATER INTAKE.  - CMP14+EGFR - CBC - Lipid panel - Hemoglobin A1c - POCT Urinalysis Dipstick (81002)  2. Pap smear, as part of routine gynecological examination  Pap smear performed.  Rectal exam performed, stool is heme negative.   - Cytology -Pap Smear        Maximino Greenland, MD

## 2018-11-22 LAB — LIPID PANEL
Chol/HDL Ratio: 3 ratio (ref 0.0–4.4)
Cholesterol, Total: 161 mg/dL (ref 100–199)
HDL: 53 mg/dL (ref >39)
LDL CALC: 91 mg/dL (ref 0–99)
TRIGLYCERIDES: 87 mg/dL (ref 0–149)
VLDL Cholesterol Cal: 17 mg/dL (ref 5–40)

## 2018-11-22 LAB — CMP14+EGFR
A/G RATIO: 1.4 (ref 1.2–2.2)
ALBUMIN: 4.2 g/dL (ref 3.8–4.8)
ALT: 7 IU/L (ref 0–32)
AST: 12 IU/L (ref 0–40)
Alkaline Phosphatase: 49 IU/L (ref 39–117)
BUN / CREAT RATIO: 11 (ref 9–23)
BUN: 8 mg/dL (ref 6–24)
Bilirubin Total: 0.4 mg/dL (ref 0.0–1.2)
CO2: 21 mmol/L (ref 20–29)
Calcium: 9.7 mg/dL (ref 8.7–10.2)
Chloride: 104 mmol/L (ref 96–106)
Creatinine, Ser: 0.72 mg/dL (ref 0.57–1.00)
GFR, EST AFRICAN AMERICAN: 120 mL/min/{1.73_m2} (ref >59)
GFR, EST NON AFRICAN AMERICAN: 104 mL/min/{1.73_m2} (ref >59)
Globulin, Total: 2.9 g/dL (ref 1.5–4.5)
Glucose: 70 mg/dL (ref 65–99)
POTASSIUM: 4.7 mmol/L (ref 3.5–5.2)
Sodium: 141 mmol/L (ref 134–144)
TOTAL PROTEIN: 7.1 g/dL (ref 6.0–8.5)

## 2018-11-22 LAB — CBC
Hematocrit: 35.7 % (ref 34.0–46.6)
Hemoglobin: 10.9 g/dL — ABNORMAL LOW (ref 11.1–15.9)
MCH: 21.8 pg — AB (ref 26.6–33.0)
MCHC: 30.5 g/dL — ABNORMAL LOW (ref 31.5–35.7)
MCV: 72 fL — ABNORMAL LOW (ref 79–97)
Platelets: 396 10*3/uL (ref 150–450)
RBC: 4.99 x10E6/uL (ref 3.77–5.28)
RDW: 15.4 % (ref 11.7–15.4)
WBC: 4.4 10*3/uL (ref 3.4–10.8)

## 2018-11-22 LAB — HEMOGLOBIN A1C
Est. average glucose Bld gHb Est-mCnc: 117 mg/dL
Hgb A1c MFr Bld: 5.7 % — ABNORMAL HIGH (ref 4.8–5.6)

## 2018-11-24 LAB — CYTOLOGY - PAP
Diagnosis: NEGATIVE
HPV: NOT DETECTED

## 2018-11-29 LAB — IRON AND TIBC
Iron Saturation: 13 % — ABNORMAL LOW (ref 15–55)
Iron: 47 ug/dL (ref 27–159)
Total Iron Binding Capacity: 350 ug/dL (ref 250–450)
UIBC: 303 ug/dL (ref 131–425)

## 2018-11-29 LAB — SPECIMEN STATUS REPORT

## 2018-12-05 ENCOUNTER — Encounter: Payer: Self-pay | Admitting: Internal Medicine

## 2018-12-12 ENCOUNTER — Other Ambulatory Visit: Payer: Self-pay | Admitting: Internal Medicine

## 2018-12-14 ENCOUNTER — Encounter: Payer: Self-pay | Admitting: Internal Medicine

## 2018-12-17 ENCOUNTER — Encounter: Payer: Self-pay | Admitting: Internal Medicine

## 2019-03-27 ENCOUNTER — Other Ambulatory Visit: Payer: Self-pay | Admitting: Internal Medicine

## 2019-04-10 ENCOUNTER — Other Ambulatory Visit: Payer: Self-pay | Admitting: Internal Medicine

## 2019-05-01 DIAGNOSIS — H109 Unspecified conjunctivitis: Secondary | ICD-10-CM | POA: Diagnosis not present

## 2019-05-10 DIAGNOSIS — Z20828 Contact with and (suspected) exposure to other viral communicable diseases: Secondary | ICD-10-CM | POA: Diagnosis not present

## 2019-05-20 ENCOUNTER — Other Ambulatory Visit: Payer: Self-pay | Admitting: Internal Medicine

## 2019-05-25 ENCOUNTER — Other Ambulatory Visit: Payer: Self-pay | Admitting: Internal Medicine

## 2019-05-28 ENCOUNTER — Other Ambulatory Visit: Payer: Self-pay | Admitting: Internal Medicine

## 2019-05-29 ENCOUNTER — Encounter: Payer: Self-pay | Admitting: Internal Medicine

## 2019-07-03 ENCOUNTER — Other Ambulatory Visit: Payer: Self-pay | Admitting: Internal Medicine

## 2019-08-20 ENCOUNTER — Encounter: Payer: Self-pay | Admitting: Internal Medicine

## 2019-08-21 ENCOUNTER — Other Ambulatory Visit: Payer: Self-pay

## 2019-08-21 DIAGNOSIS — Z20822 Contact with and (suspected) exposure to covid-19: Secondary | ICD-10-CM

## 2019-08-22 LAB — NOVEL CORONAVIRUS, NAA: SARS-CoV-2, NAA: NOT DETECTED

## 2019-10-02 ENCOUNTER — Other Ambulatory Visit: Payer: Self-pay | Admitting: Internal Medicine

## 2019-11-27 ENCOUNTER — Encounter: Payer: Self-pay | Admitting: Internal Medicine

## 2019-11-27 ENCOUNTER — Ambulatory Visit: Payer: 59 | Admitting: Internal Medicine

## 2019-11-27 ENCOUNTER — Other Ambulatory Visit: Payer: Self-pay

## 2019-11-27 VITALS — BP 114/66 | HR 97 | Temp 97.6°F | Ht 66.8 in | Wt 236.8 lb

## 2019-11-27 DIAGNOSIS — E6609 Other obesity due to excess calories: Secondary | ICD-10-CM | POA: Diagnosis not present

## 2019-11-27 DIAGNOSIS — H6121 Impacted cerumen, right ear: Secondary | ICD-10-CM | POA: Diagnosis not present

## 2019-11-27 DIAGNOSIS — Z23 Encounter for immunization: Secondary | ICD-10-CM

## 2019-11-27 DIAGNOSIS — F5101 Primary insomnia: Secondary | ICD-10-CM

## 2019-11-27 DIAGNOSIS — Z0000 Encounter for general adult medical examination without abnormal findings: Secondary | ICD-10-CM

## 2019-11-27 DIAGNOSIS — Z6837 Body mass index (BMI) 37.0-37.9, adult: Secondary | ICD-10-CM

## 2019-11-27 LAB — POCT URINALYSIS DIPSTICK
Bilirubin, UA: NEGATIVE
Blood, UA: NEGATIVE
Glucose, UA: NEGATIVE
Ketones, UA: NEGATIVE
Leukocytes, UA: NEGATIVE
Nitrite, UA: NEGATIVE
Protein, UA: NEGATIVE
Spec Grav, UA: <=1.005 — AB (ref 1.010–1.025)
Urobilinogen, UA: 0.2 E.U./dL
pH, UA: 6 (ref 5.0–8.0)

## 2019-11-27 MED ORDER — ESZOPICLONE 3 MG PO TABS: ORAL_TABLET | ORAL | 4 refills | Status: DC

## 2019-11-27 MED ORDER — TETANUS-DIPHTH-ACELL PERTUSSIS 5-2.5-18.5 LF-MCG/0.5 IM SUSP
0.5000 mL | Freq: Once | INTRAMUSCULAR | Status: AC
  Administered 2019-11-27: 0.5 mL via INTRAMUSCULAR

## 2019-11-27 NOTE — Patient Instructions (Signed)
Health Maintenance, Female Adopting a healthy lifestyle and getting preventive care are important in promoting health and wellness. Ask your health care provider about:  The right schedule for you to have regular tests and exams.  Things you can do on your own to prevent diseases and keep yourself healthy. What should I know about diet, weight, and exercise? Eat a healthy diet   Eat a diet that includes plenty of vegetables, fruits, low-fat dairy products, and lean protein.  Do not eat a lot of foods that are high in solid fats, added sugars, or sodium. Maintain a healthy weight Body mass index (BMI) is used to identify weight problems. It estimates body fat based on height and weight. Your health care provider can help determine your BMI and help you achieve or maintain a healthy weight. Get regular exercise Get regular exercise. This is one of the most important things you can do for your health. Most adults should:  Exercise for at least 150 minutes each week. The exercise should increase your heart rate and make you sweat (moderate-intensity exercise).  Do strengthening exercises at least twice a week. This is in addition to the moderate-intensity exercise.  Spend less time sitting. Even light physical activity can be beneficial. Watch cholesterol and blood lipids Have your blood tested for lipids and cholesterol at 43 years of age, then have this test every 5 years. Have your cholesterol levels checked more often if:  Your lipid or cholesterol levels are high.  You are older than 43 years of age.  You are at high risk for heart disease. What should I know about cancer screening? Depending on your health history and family history, you may need to have cancer screening at various ages. This may include screening for:  Breast cancer.  Cervical cancer.  Colorectal cancer.  Skin cancer.  Lung cancer. What should I know about heart disease, diabetes, and high blood  pressure? Blood pressure and heart disease  High blood pressure causes heart disease and increases the risk of stroke. This is more likely to develop in people who have high blood pressure readings, are of African descent, or are overweight.  Have your blood pressure checked: ? Every 3-5 years if you are 18-39 years of age. ? Every year if you are 40 years old or older. Diabetes Have regular diabetes screenings. This checks your fasting blood sugar level. Have the screening done:  Once every three years after age 40 if you are at a normal weight and have a low risk for diabetes.  More often and at a younger age if you are overweight or have a high risk for diabetes. What should I know about preventing infection? Hepatitis B If you have a higher risk for hepatitis B, you should be screened for this virus. Talk with your health care provider to find out if you are at risk for hepatitis B infection. Hepatitis C Testing is recommended for:  Everyone born from 1945 through 1965.  Anyone with known risk factors for hepatitis C. Sexually transmitted infections (STIs)  Get screened for STIs, including gonorrhea and chlamydia, if: ? You are sexually active and are younger than 43 years of age. ? You are older than 43 years of age and your health care provider tells you that you are at risk for this type of infection. ? Your sexual activity has changed since you were last screened, and you are at increased risk for chlamydia or gonorrhea. Ask your health care provider if   you are at risk.  Ask your health care provider about whether you are at high risk for HIV. Your health care provider may recommend a prescription medicine to help prevent HIV infection. If you choose to take medicine to prevent HIV, you should first get tested for HIV. You should then be tested every 3 months for as long as you are taking the medicine. Pregnancy  If you are about to stop having your period (premenopausal) and  you may become pregnant, seek counseling before you get pregnant.  Take 400 to 800 micrograms (mcg) of folic acid every day if you become pregnant.  Ask for birth control (contraception) if you want to prevent pregnancy. Osteoporosis and menopause Osteoporosis is a disease in which the bones lose minerals and strength with aging. This can result in bone fractures. If you are 65 years old or older, or if you are at risk for osteoporosis and fractures, ask your health care provider if you should:  Be screened for bone loss.  Take a calcium or vitamin D supplement to lower your risk of fractures.  Be given hormone replacement therapy (HRT) to treat symptoms of menopause. Follow these instructions at home: Lifestyle  Do not use any products that contain nicotine or tobacco, such as cigarettes, e-cigarettes, and chewing tobacco. If you need help quitting, ask your health care provider.  Do not use street drugs.  Do not share needles.  Ask your health care provider for help if you need support or information about quitting drugs. Alcohol use  Do not drink alcohol if: ? Your health care provider tells you not to drink. ? You are pregnant, may be pregnant, or are planning to become pregnant.  If you drink alcohol: ? Limit how much you use to 0-1 drink a day. ? Limit intake if you are breastfeeding.  Be aware of how much alcohol is in your drink. In the [place] one drink equals one 12 oz bottle of beer (355 mL), one 5 oz glass of wine (148 mL), or one 1 oz glass of hard liquor (44 mL). General instructions  Schedule regular health, dental, and eye exams.  Stay current with your vaccines.  Tell your health care provider if: ? You often feel depressed. ? You have ever been abused or do not feel safe at home. Summary  Adopting a healthy lifestyle and getting preventive care are important in promoting health and wellness.  Follow your health care provider's instructions about healthy  diet, exercising, and getting tested or screened for diseases.  Follow your health care provider's instructions on monitoring your cholesterol and blood pressure. This information is not intended to replace advice given to you by your health care provider. Make sure you discuss any questions you have with your health care provider. Document Revised: 09/26/2018 Document Reviewed: 09/26/2018 Elsevier Patient Education  2020 [employer]  

## 2019-11-28 LAB — CMP14+EGFR
ALT: 9 IU/L (ref 0–32)
AST: 12 IU/L (ref 0–40)
Albumin/Globulin Ratio: 1.3 (ref 1.2–2.2)
Albumin: 4.3 g/dL (ref 3.8–4.8)
Alkaline Phosphatase: 79 IU/L (ref 39–117)
BUN/Creatinine Ratio: 13 (ref 9–23)
BUN: 10 mg/dL (ref 6–24)
Bilirubin Total: 0.6 mg/dL (ref 0.0–1.2)
CO2: 20 mmol/L (ref 20–29)
Calcium: 9.5 mg/dL (ref 8.7–10.2)
Chloride: 101 mmol/L (ref 96–106)
Creatinine, Ser: 0.75 mg/dL (ref 0.57–1.00)
GFR calc Af Amer: 114 mL/min/{1.73_m2} (ref >59)
GFR calc non Af Amer: 99 mL/min/{1.73_m2} (ref >59)
Globulin, Total: 3.3 g/dL (ref 1.5–4.5)
Glucose: 85 mg/dL (ref 65–99)
Potassium: 4.3 mmol/L (ref 3.5–5.2)
Sodium: 137 mmol/L (ref 134–144)
Total Protein: 7.6 g/dL (ref 6.0–8.5)

## 2019-11-28 LAB — CBC
Hematocrit: 36.6 % (ref 34.0–46.6)
Hemoglobin: 11.4 g/dL (ref 11.1–15.9)
MCH: 22.2 pg — ABNORMAL LOW (ref 26.6–33.0)
MCHC: 31.1 g/dL — ABNORMAL LOW (ref 31.5–35.7)
MCV: 71 fL — ABNORMAL LOW (ref 79–97)
Platelets: 390 10*3/uL (ref 150–450)
RBC: 5.14 x10E6/uL (ref 3.77–5.28)
RDW: 15.2 % (ref 11.7–15.4)
WBC: 5.3 10*3/uL (ref 3.4–10.8)

## 2019-11-28 LAB — LIPID PANEL
Chol/HDL Ratio: 3.3 ratio (ref 0.0–4.4)
Cholesterol, Total: 185 mg/dL (ref 100–199)
HDL: 56 mg/dL (ref >39)
LDL Chol Calc (NIH): 110 mg/dL — ABNORMAL HIGH (ref 0–99)
Triglycerides: 103 mg/dL (ref 0–149)
VLDL Cholesterol Cal: 19 mg/dL (ref 5–40)

## 2019-11-28 LAB — TSH: TSH: 0.381 u[IU]/mL — ABNORMAL LOW (ref 0.450–4.500)

## 2019-11-28 LAB — HEMOGLOBIN A1C
Est. average glucose Bld gHb Est-mCnc: 134 mg/dL
Hgb A1c MFr Bld: 6.3 % — ABNORMAL HIGH (ref 4.8–5.6)

## 2019-11-29 NOTE — Progress Notes (Signed)
This visit occurred during the SARS-CoV-2 public health emergency.  Safety protocols were in place, including screening questions prior to the visit, additional usage of staff PPE, and extensive cleaning of exam room while observing appropriate contact time as indicated for disinfecting solutions.  Subjective:     Patient ID: Kelly Charles , female    DOB: 1976-05-13 , 44 y.o.   MRN: [id number]   Chief Complaint  Patient presents with  . Annual Exam  . Immunizations    Tdap    HPI  She is here today for a full physical examination. She is not followed by GYN.     Past Medical History:  Diagnosis Date  . Diabetes mellitus without complication ([place])   . Trigeminal neuralgia   . Trigeminal neuralgia   . Trigeminal neuralgia      Family History  Problem Relation Age of Onset  . Cancer Maternal Grandfather        BLADDER  . Cancer Father        PROSTATE  . Diabetes Father   . Diabetes Mother   . Mental illness Mother        ANXIETY  . Aneurysm Mother        Small cerebral aneurysm  . Thyroid disease Maternal Aunt   . Cancer Maternal Aunt        LUNG  . Hypertension Maternal Uncle   . Diabetes Maternal Uncle   . Hypertension Maternal Aunt   . Diabetes Maternal Aunt   . Mental illness Maternal Aunt   . Breast cancer Maternal Aunt      Current Outpatient Medications:  .  DULoxetine (CYMBALTA) 60 MG capsule, TAKE 1 CAPSULE BY MOUTH EVERY DAY (Patient taking differently: 2 per day), Disp: 90 capsule, Rfl: 0 .  EPINEPHrine (EPIPEN 2-PAK) 0.3 mg/0.3 mL IJ SOAJ injection, Inject 0.3 mLs (0.3 mg total) into the muscle once as needed (for severe allergic reaction). CAll 911 immediately if you have to use this medicine, Disp: 1 Device, Rfl: 1 .  folic acid (FOLVITE) 1 MG tablet, TAKE 1 TABLET BY MOUTH EVERY DAY, Disp: 90 tablet, Rfl: 1 .  metFORMIN (GLUCOPHAGE) 1000 MG tablet, Take 1,000 mg by mouth daily with breakfast. , Disp: , Rfl:  .  Cholecalciferol  (VITAMIN D-1000 MAX ST) 1000 units tablet, Take 5,000 Units by mouth. , Disp: , Rfl:  .  Eszopiclone 3 MG TABS, TAKE 1 TABLET BY MOUTH EVERY DAY AT BEDTIME AS NEEDED FOR SLEEP, Disp: 30 tablet, Rfl: 4   Allergies  Allergen Reactions  . Levaquin [Levofloxacin] Anaphylaxis    Hives, dyspepsia, shortness of breath  . Penicillins Rash     Review of Systems  Constitutional: Negative.   HENT: Negative.   Eyes: Negative.   Respiratory: Negative.   Cardiovascular: Negative.   Endocrine: Negative.   Genitourinary: Negative.   Musculoskeletal: Negative.   Skin: Negative.   Allergic/Immunologic: Negative.   Neurological: Negative.   Hematological: Negative.   Psychiatric/Behavioral: Positive for sleep disturbance.       She has h/o insomnia. She has taken Lunesta in the past. She would like to get refill.      Today's Vitals   11/27/19 0917  BP: 114/66  Pulse: 97  Temp: 97.6 F (36.4 C)  TempSrc: Oral  Weight: 236 lb 12.8 oz (107.4 kg)  Height: 5' 6.8" (1.697 m)   Body mass index is 37.31 kg/m.   Objective:  Physical Exam Vitals and nursing note reviewed.  Constitutional:  Appearance: Normal appearance.  HENT:     Head: Normocephalic and atraumatic.     Right Ear: Ear canal and external ear normal. There is impacted cerumen.     Left Ear: Tympanic membrane, ear canal and external ear normal.     Ears:     Comments: Hard, impacted cerumen on right.     Nose:     Comments: Deferred, masked    Mouth/Throat:     Comments: Deferred, masked Eyes:     Extraocular Movements: Extraocular movements intact.     Conjunctiva/sclera: Conjunctivae normal.     Pupils: Pupils are equal, round, and reactive to light.  Cardiovascular:     Rate and Rhythm: Normal rate and regular rhythm.     Pulses: Normal pulses.     Heart sounds: Normal heart sounds.  Pulmonary:     Effort: Pulmonary effort is normal.     Breath sounds: Normal breath sounds.  Chest:     Breasts: Tanner Score  is 5.        Right: Normal.        Left: Normal.  Abdominal:     General: Bowel sounds are normal.     Palpations: Abdomen is soft.  Genitourinary:    Comments: deferred Musculoskeletal:        General: Normal range of motion.     Cervical back: Normal range of motion and neck supple.  Skin:    General: Skin is warm and dry.  Neurological:     General: No focal deficit present.     Mental Status: She is alert and oriented to person, place, and time.  Psychiatric:        Mood and Affect: Mood normal.        Behavior: Behavior normal.         Assessment And Plan:     1. Routine general medical examination at health care facility  A full exam was performed. Importance of monthly self breast exams was discussed with the patient. PATIENT IS ADVISED TO GET 30-45 MINUTES REGULAR EXERCISE NO LESS THAN FOUR TO FIVE DAYS PER WEEK - BOTH WEIGHTBEARING EXERCISES AND AEROBIC ARE RECOMMENDED.  SHE IS ADVISED TO FOLLOW A HEALTHY DIET WITH AT LEAST SIX FRUITS/VEGGIES PER DAY, DECREASE INTAKE OF RED MEAT, AND TO INCREASE FISH INTAKE TO TWO DAYS PER WEEK.  MEATS/FISH SHOULD NOT BE FRIED, BAKED OR BROILED IS PREFERABLE.  I SUGGEST WEARING SPF 50 SUNSCREEN ON EXPOSED PARTS AND ESPECIALLY WHEN IN THE DIRECT SUNLIGHT FOR AN EXTENDED PERIOD OF TIME.  PLEASE AVOID FAST FOOD RESTAURANTS AND INCREASE YOUR WATER INTAKE.  - POCT Urinalysis Dipstick (81002) - CMP14+EGFR - CBC - Lipid panel - Hemoglobin A1c - TSH  2. Right ear impacted cerumen  AFTER OBTAINING VERBAL CONSENT, RIGHT EAR WAS FLUSHED BY IRRIGATION. SHE TOLERATED PROCEDURE WELL WITHOUT ANY COMPLICATIONS. NO TM ABNORMALITIES WERE NOTED.  - Ear Lavage  3. Primary insomnia  Chronic. She was given refill of Lunesta, '3mg'$  nightly prn. She is also encouraged to practice good bedtime hygiene.   - Eszopiclone 3 MG TABS; TAKE 1 TABLET BY MOUTH EVERY DAY AT BEDTIME AS NEEDED FOR SLEEP  Dispense: 30 tablet; Refill: 4  4. Class 2 obesity due to  excess calories without serious comorbidity with body mass index (BMI) of 37.0 to 37.9 in adult  She is encouraged to strive for BMI less than 30 to decrease cardiac risk. She is advised to exercise no less than 30 minutes five days  per week.   5. Immunization due  - Tdap (BOOSTRIX) injection 0.5 mL        Maximino Greenland, MD    THE PATIENT IS ENCOURAGED TO PRACTICE SOCIAL DISTANCING DUE TO THE COVID-19 PANDEMIC.

## 2019-11-30 LAB — SPECIMEN STATUS REPORT

## 2019-11-30 LAB — T4, FREE: Free T4: 1.32 ng/dL (ref 0.82–1.77)

## 2019-11-30 LAB — T3, FREE: T3, Free: 2.7 pg/mL (ref 2.0–4.4)

## 2019-12-06 ENCOUNTER — Encounter: Payer: Self-pay | Admitting: Internal Medicine

## 2019-12-09 ENCOUNTER — Encounter: Payer: Self-pay | Admitting: Internal Medicine

## 2019-12-09 ENCOUNTER — Other Ambulatory Visit: Payer: Self-pay

## 2019-12-09 MED ORDER — METFORMIN HCL 1000 MG PO TABS: 1000.0000 mg | ORAL_TABLET | Freq: Every day | ORAL | 0 refills | Status: DC

## 2019-12-25 ENCOUNTER — Other Ambulatory Visit: Payer: Self-pay | Admitting: Internal Medicine

## 2020-01-01 ENCOUNTER — Other Ambulatory Visit: Payer: Self-pay | Admitting: Internal Medicine

## 2020-01-10 ENCOUNTER — Ambulatory Visit: Payer: 59 | Attending: Internal Medicine

## 2020-01-10 DIAGNOSIS — Z23 Encounter for immunization: Secondary | ICD-10-CM

## 2020-01-10 NOTE — Progress Notes (Signed)
   Covid-19 Vaccination Clinic  Name:  Kelly Charles    MRN: [id number] DOB: 05/06/1976  01/10/2020  [name] was observed post Covid-19 immunization for 30 minutes based on pre-vaccination screening without incident. She was provided with Vaccine Information Sheet and instruction to access the V-Safe system.   [name] was instructed to call 911 with any severe reactions post vaccine: Marland Kitchen Difficulty breathing  . Swelling of face and throat  . A fast heartbeat  . A bad rash all over body  . Dizziness and weakness   Immunizations Administered    Name Date Dose VIS Date Route   Pfizer COVID-19 Vaccine 01/10/2020 10:10 AM 0.3 mL 09/27/2019 Intramuscular   Manufacturer: [employer], [employer]   Lot: [id number]   NDC: [address]-5

## 2020-01-25 ENCOUNTER — Other Ambulatory Visit: Payer: Self-pay | Admitting: Internal Medicine

## 2020-02-03 ENCOUNTER — Ambulatory Visit: Payer: 59 | Attending: Internal Medicine

## 2020-02-03 DIAGNOSIS — Z23 Encounter for immunization: Secondary | ICD-10-CM

## 2020-02-03 NOTE — Progress Notes (Signed)
   Covid-19 Vaccination Clinic  Name:  Kelly Charles    MRN: [id number] DOB: April 17, 1976  02/03/2020  Kelly Charles was observed post Covid-19 immunization for 30 minutes based on pre-vaccination screening without incident. She was provided with Vaccine Information Sheet and instruction to access the V-Safe system.   Kelly Charles was instructed to call 911 with any severe reactions post vaccine: Marland Kitchen Difficulty breathing  . Swelling of face and throat  . A fast heartbeat  . A bad rash all over body  . Dizziness and weakness   Immunizations Administered    Name Date Dose VIS Date Route   Pfizer COVID-19 Vaccine 02/03/2020  3:04 PM 0.3 mL 12/11/2018 Intramuscular   Manufacturer: [employer], [employer]   Lot: [id number]   NDC: [address]-8

## 2020-02-18 ENCOUNTER — Other Ambulatory Visit: Payer: Self-pay | Admitting: Internal Medicine

## 2020-05-01 ENCOUNTER — Other Ambulatory Visit: Payer: Self-pay | Admitting: Internal Medicine

## 2020-05-01 DIAGNOSIS — F5101 Primary insomnia: Secondary | ICD-10-CM

## 2020-05-04 NOTE — Telephone Encounter (Signed)
Eszopiclone refill  

## 2020-06-19 ENCOUNTER — Other Ambulatory Visit: Payer: Self-pay | Admitting: Internal Medicine

## 2020-08-11 DIAGNOSIS — Z87898 Personal history of other specified conditions: Secondary | ICD-10-CM

## 2020-08-11 HISTORY — DX: Personal history of other specified conditions: Z87.898

## 2020-08-20 ENCOUNTER — Other Ambulatory Visit: Payer: Self-pay | Admitting: Internal Medicine

## 2020-09-19 ENCOUNTER — Ambulatory Visit: Payer: 59 | Attending: Internal Medicine

## 2020-09-19 DIAGNOSIS — Z23 Encounter for immunization: Secondary | ICD-10-CM

## 2020-09-19 NOTE — Progress Notes (Signed)
   Covid-19 Vaccination Clinic  Name:  Kelly Charles    MRN: [id number] DOB: Dec 13, 1975  09/19/2020  [name] was observed post Covid-19 immunization for 15 minutes without incident. She was provided with Vaccine Information Sheet and instruction to access the V-Safe system.   [name] was instructed to call 911 with any severe reactions post vaccine: Marland Kitchen Difficulty breathing  . Swelling of face and throat  . A fast heartbeat  . A bad rash all over body  . Dizziness and weakness   Immunizations Administered    Name Date Dose VIS Date Route   Pfizer COVID-19 Vaccine 09/19/2020 11:42 AM 0.3 mL 08/05/2020 Intramuscular   Manufacturer: [employer], [employer]   Lot: [id number]   NDC: [address]-8

## 2020-10-08 ENCOUNTER — Encounter: Payer: Self-pay | Admitting: Internal Medicine

## 2020-10-08 ENCOUNTER — Other Ambulatory Visit: Payer: Self-pay | Admitting: Internal Medicine

## 2020-10-08 DIAGNOSIS — F5101 Primary insomnia: Secondary | ICD-10-CM

## 2020-10-12 NOTE — Telephone Encounter (Signed)
Can you send for sanders thank you

## 2020-10-15 ENCOUNTER — Encounter: Payer: Self-pay | Admitting: Nurse Practitioner

## 2020-10-15 ENCOUNTER — Ambulatory Visit: Payer: 59 | Admitting: Nurse Practitioner

## 2020-10-15 ENCOUNTER — Other Ambulatory Visit: Payer: Self-pay

## 2020-10-15 VITALS — BP 132/78 | HR 95 | Temp 98.1°F | Ht 66.0 in | Wt 230.0 lb

## 2020-10-15 DIAGNOSIS — Z6837 Body mass index (BMI) 37.0-37.9, adult: Secondary | ICD-10-CM

## 2020-10-15 DIAGNOSIS — Z23 Encounter for immunization: Secondary | ICD-10-CM | POA: Diagnosis not present

## 2020-10-15 DIAGNOSIS — E6609 Other obesity due to excess calories: Secondary | ICD-10-CM | POA: Diagnosis not present

## 2020-10-15 DIAGNOSIS — F5101 Primary insomnia: Secondary | ICD-10-CM

## 2020-10-15 DIAGNOSIS — Z79899 Other long term (current) drug therapy: Secondary | ICD-10-CM

## 2020-10-15 DIAGNOSIS — F419 Anxiety disorder, unspecified: Secondary | ICD-10-CM | POA: Diagnosis not present

## 2020-10-15 DIAGNOSIS — F3289 Other specified depressive episodes: Secondary | ICD-10-CM

## 2020-10-15 DIAGNOSIS — E66812 Obesity, class 2: Secondary | ICD-10-CM

## 2020-10-15 DIAGNOSIS — Z1159 Encounter for screening for other viral diseases: Secondary | ICD-10-CM

## 2020-10-15 MED ORDER — BUSPIRONE HCL 10 MG PO TABS: 10.0000 mg | ORAL_TABLET | Freq: Three times a day (TID) | ORAL | 2 refills | Status: DC

## 2020-10-15 NOTE — Progress Notes (Signed)
I,Kelly Charles,acting as a Education administrator for [employer], FNP.,have documented all relevant documentation on the behalf of Kelly Brine, FNP,as directed by  Kelly Brine, FNP while in the presence of Kelly Charles, [place].  This visit occurred during the SARS-CoV-2 public health emergency.  Safety protocols were in place, including screening questions prior to the visit, additional usage of staff PPE, and extensive cleaning of exam room while observing appropriate contact time as indicated for disinfecting solutions.  Subjective:     Patient ID: Kelly Charles , female    DOB: 08-02-1976 , 44 y.o.   MRN: [id number]   Chief Complaint  Patient presents with  . Insomnia    HPI  She is here today for medication check, takes lunesta nightly. She is doing well. She is also taking duloxetine. She has been feeling anxious lately.  She has been taking calm but does not like the powder taste.  She has also tried [place].  She has not taken any other medications in the past.   Insomnia The onset quality is sudden. The symptoms are aggravated by anxiety. PMH includes: associated symptoms present.  Anxiety Presents for follow-up visit. Symptoms include depressed mood, insomnia and nervous/anxious behavior. Patient reports no chest pain or dizziness. Primary symptoms comment: she will wake up feeling nervous. The severity of symptoms is moderate.       Past Medical History:  Diagnosis Date  . Diabetes mellitus without complication ([place])   . Trigeminal neuralgia   . Trigeminal neuralgia   . Trigeminal neuralgia      Family History  Problem Relation Age of Onset  . Cancer Maternal Grandfather        BLADDER  . Cancer Father        PROSTATE  . Diabetes Father   . Diabetes Mother   . Mental illness Mother        ANXIETY  . Aneurysm Mother        Small cerebral aneurysm  . Thyroid disease Maternal Aunt   . Cancer Maternal Aunt        LUNG  . Hypertension Maternal Uncle   .  Diabetes Maternal Uncle   . Hypertension Maternal Aunt   . Diabetes Maternal Aunt   . Mental illness Maternal Aunt   . Breast cancer Maternal Aunt      Current Outpatient Medications:  .  busPIRone (BUSPAR) 10 MG tablet, Take 1 tablet (10 mg total) by mouth 3 (three) times daily., Disp: 30 tablet, Rfl: 2 .  DULoxetine (CYMBALTA) 60 MG capsule, TAKE 1 CAPSULE BY MOUTH EVERY DAY, Disp: 90 capsule, Rfl: 1 .  EPINEPHrine (EPIPEN 2-PAK) 0.3 mg/0.3 mL IJ SOAJ injection, Inject 0.3 mLs (0.3 mg total) into the muscle once as needed (for severe allergic reaction). CAll 911 immediately if you have to use this medicine, Disp: 1 Device, Rfl: 1 .  Eszopiclone 3 MG TABS, TAKE 1 TABLET BY MOUTH EVERY DAY AT BEDTIME AS NEEDED FOR SLEEP, Disp: 30 tablet, Rfl: 2 .  folic acid (FOLVITE) 1 MG tablet, TAKE 1 TABLET BY MOUTH EVERY DAY, Disp: 90 tablet, Rfl: 1 .  metFORMIN (GLUCOPHAGE) 1000 MG tablet, TAKE 1 TABLET BY MOUTH EVERY DAY WITH BREAKFAST, Disp: 90 tablet, Rfl: 1   Allergies  Allergen Reactions  . Levaquin [Levofloxacin] Anaphylaxis    Hives, dyspepsia, shortness of breath  . Penicillins Rash     Review of Systems  Constitutional: Negative.   Respiratory: Negative.   Cardiovascular: Negative.  Negative for chest  pain.  Gastrointestinal: Negative.   Neurological: Negative.  Negative for dizziness.  Psychiatric/Behavioral: Positive for behavioral problems. The patient is nervous/anxious and has insomnia.        Depression and anxiety      Today's Vitals   10/15/20 1210  BP: 132/78  Pulse: 95  Temp: 98.1 F (36.7 C)  TempSrc: Oral  Weight: 230 lb (104.3 kg)  Height: _0  (1.676 m)   Body mass index is 37.12 kg/m.  Wt Readings from Last 3 Encounters:  10/15/20 230 lb (104.3 kg)  11/27/19 236 lb 12.8 oz (107.4 kg)  11/21/18 212 lb 12.8 oz (96.5 kg)     Objective:  Physical Exam Vitals reviewed.  Constitutional:      General: She is not in acute distress.    Appearance: Normal  appearance. She is obese.  Cardiovascular:     Rate and Rhythm: Normal rate and regular rhythm.     Pulses: Normal pulses.     Heart sounds: Normal heart sounds. No murmur heard.   Pulmonary:     Effort: Pulmonary effort is normal. No respiratory distress.     Breath sounds: Normal breath sounds.  Neurological:     General: No focal deficit present.     Mental Status: She is alert and oriented to person, place, and time.     Cranial Nerves: No cranial nerve deficit.  Psychiatric:        Mood and Affect: Mood normal.        Behavior: Behavior normal.        Thought Content: Thought content normal.        Judgment: Judgment normal.         Assessment And Plan:     1. Primary insomnia  Chronic, continue with Lunesta  2. Anxiety  Chronic, she is to continue with buspirone - busPIRone (BUSPAR) 10 MG tablet; Take 1 tablet (10 mg total) by mouth 3 (three) times daily.  Dispense: 30 tablet; Refill: 2  3. Other depression - busPIRone (BUSPAR) 10 MG tablet; Take 1 tablet (10 mg total) by mouth 3 (three) times daily.  Dispense: 30 tablet; Refill: 2  4. Class 2 obesity due to excess calories without serious comorbidity with body mass index (BMI) of 37.0 to 37.9 in adult  Chronic  Discussed healthy diet and regular exercise options   Encouraged to exercise at least 150 minutes per week with 2 days of strength training as tolerated  She is encouraged to strive for BMI less than 30 to decrease cardiac risk. Advised to aim for at least 150 minutes of exercise per week. 5. Other long term (current) drug therapy - CMP14+EGFR - CBC  6. Encounter for hepatitis C screening test for low risk patient  Will check Hepatitis C screening due to recent recommendations to screen all adults 18 years and older - Hepatitis C antibody  7. Need for influenza vaccination  Influenza vaccine administered  Encouraged to take Tylenol as needed for fever or muscle aches. - Flu Vaccine QUAD 6+ mos  PF IM (Fluarix Quad PF)     Patient was given opportunity to ask questions. Patient verbalized understanding of the plan and was able to repeat key elements of the plan. All questions were answered to their satisfaction.  Kelly Brine, FNP    I, Kelly Brine, FNP, have reviewed all documentation for this visit. The documentation on 11/08/20 for the exam, diagnosis, procedures, and orders are all accurate and complete.    THE  PATIENT IS ENCOURAGED TO PRACTICE SOCIAL DISTANCING DUE TO THE COVID-19 PANDEMIC.

## 2020-10-15 NOTE — Patient Instructions (Signed)

## 2020-10-16 LAB — CBC
Hematocrit: 36.6 % (ref 34.0–46.6)
Hemoglobin: 11 g/dL — ABNORMAL LOW (ref 11.1–15.9)
MCH: 21.6 pg — ABNORMAL LOW (ref 26.6–33.0)
MCHC: 30.1 g/dL — ABNORMAL LOW (ref 31.5–35.7)
MCV: 72 fL — ABNORMAL LOW (ref 79–97)
Platelets: 419 10*3/uL (ref 150–450)
RBC: 5.09 x10E6/uL (ref 3.77–5.28)
RDW: 15.8 % — ABNORMAL HIGH (ref 11.7–15.4)
WBC: 6 10*3/uL (ref 3.4–10.8)

## 2020-10-16 LAB — CMP14+EGFR
ALT: 8 IU/L (ref 0–32)
AST: 11 IU/L (ref 0–40)
Albumin/Globulin Ratio: 1.3 (ref 1.2–2.2)
Albumin: 4.2 g/dL (ref 3.8–4.8)
Alkaline Phosphatase: 67 IU/L (ref 44–121)
BUN/Creatinine Ratio: 12 (ref 9–23)
BUN: 9 mg/dL (ref 6–24)
Bilirubin Total: 0.4 mg/dL (ref 0.0–1.2)
CO2: 21 mmol/L (ref 20–29)
Calcium: 9.8 mg/dL (ref 8.7–10.2)
Chloride: 104 mmol/L (ref 96–106)
Creatinine, Ser: 0.73 mg/dL (ref 0.57–1.00)
GFR calc Af Amer: 117 mL/min/{1.73_m2} (ref >59)
GFR calc non Af Amer: 101 mL/min/{1.73_m2} (ref >59)
Globulin, Total: 3.2 g/dL (ref 1.5–4.5)
Glucose: 81 mg/dL (ref 65–99)
Potassium: 5.2 mmol/L (ref 3.5–5.2)
Sodium: 139 mmol/L (ref 134–144)
Total Protein: 7.4 g/dL (ref 6.0–8.5)

## 2020-10-16 LAB — HEPATITIS C ANTIBODY: Hep C Virus Ab: <0.1 s/co ratio (ref 0.0–0.9)

## 2020-11-30 ENCOUNTER — Encounter: Payer: Self-pay | Admitting: Internal Medicine

## 2020-11-30 ENCOUNTER — Ambulatory Visit (INDEPENDENT_AMBULATORY_CARE_PROVIDER_SITE_OTHER): Payer: 59 | Admitting: Internal Medicine

## 2020-11-30 ENCOUNTER — Other Ambulatory Visit: Payer: Self-pay

## 2020-11-30 VITALS — BP 112/78 | HR 97 | Temp 97.6°F | Ht 65.4 in | Wt 230.0 lb

## 2020-11-30 DIAGNOSIS — E559 Vitamin D deficiency, unspecified: Secondary | ICD-10-CM | POA: Diagnosis not present

## 2020-11-30 DIAGNOSIS — F419 Anxiety disorder, unspecified: Secondary | ICD-10-CM | POA: Diagnosis not present

## 2020-11-30 DIAGNOSIS — Z6837 Body mass index (BMI) 37.0-37.9, adult: Secondary | ICD-10-CM

## 2020-11-30 DIAGNOSIS — E6609 Other obesity due to excess calories: Secondary | ICD-10-CM | POA: Diagnosis not present

## 2020-11-30 DIAGNOSIS — Z0001 Encounter for general adult medical examination with abnormal findings: Secondary | ICD-10-CM | POA: Diagnosis not present

## 2020-11-30 DIAGNOSIS — R7309 Other abnormal glucose: Secondary | ICD-10-CM

## 2020-11-30 DIAGNOSIS — Z0000 Encounter for general adult medical examination without abnormal findings: Secondary | ICD-10-CM

## 2020-11-30 LAB — POCT URINALYSIS DIPSTICK
Bilirubin, UA: NEGATIVE
Blood, UA: NEGATIVE
Glucose, UA: NEGATIVE
Ketones, UA: NEGATIVE
Nitrite, UA: NEGATIVE
Protein, UA: NEGATIVE
Spec Grav, UA: 1.025 (ref 1.010–1.025)
Urobilinogen, UA: 0.2 E.U./dL
pH, UA: 6 (ref 5.0–8.0)

## 2020-11-30 NOTE — Patient Instructions (Signed)
Health Maintenance, Female Adopting a healthy lifestyle and getting preventive care are important in promoting health and wellness. Ask your health care provider about:  The right schedule for you to have regular tests and exams.  Things you can do on your own to prevent diseases and keep yourself healthy. What should I know about diet, weight, and exercise? Eat a healthy diet  Eat a diet that includes plenty of vegetables, fruits, low-fat dairy products, and lean protein.  Do not eat a lot of foods that are high in solid fats, added sugars, or sodium.   Maintain a healthy weight Body mass index (BMI) is used to identify weight problems. It estimates body fat based on height and weight. Your health care provider can help determine your BMI and help you achieve or maintain a healthy weight. Get regular exercise Get regular exercise. This is one of the most important things you can do for your health. Most adults should:  Exercise for at least 150 minutes each week. The exercise should increase your heart rate and make you sweat (moderate-intensity exercise).  Do strengthening exercises at least twice a week. This is in addition to the moderate-intensity exercise.  Spend less time sitting. Even light physical activity can be beneficial. Watch cholesterol and blood lipids Have your blood tested for lipids and cholesterol at 44 years of age, then have this test every 5 years. Have your cholesterol levels checked more often if:  Your lipid or cholesterol levels are high.  You are older than 44 years of age.  You are at high risk for heart disease. What should I know about cancer screening? Depending on your health history and family history, you may need to have cancer screening at various ages. This may include screening for:  Breast cancer.  Cervical cancer.  Colorectal cancer.  Skin cancer.  Lung cancer. What should I know about heart disease, diabetes, and high blood  pressure? Blood pressure and heart disease  High blood pressure causes heart disease and increases the risk of stroke. This is more likely to develop in people who have high blood pressure readings, are of African descent, or are overweight.  Have your blood pressure checked: ? Every 3-5 years if you are 18-39 years of age. ? Every year if you are 40 years old or older. Diabetes Have regular diabetes screenings. This checks your fasting blood sugar level. Have the screening done:  Once every three years after age 40 if you are at a normal weight and have a low risk for diabetes.  More often and at a younger age if you are overweight or have a high risk for diabetes. What should I know about preventing infection? Hepatitis B If you have a higher risk for hepatitis B, you should be screened for this virus. Talk with your health care provider to find out if you are at risk for hepatitis B infection. Hepatitis C Testing is recommended for:  Everyone born from 1945 through 1965.  Anyone with known risk factors for hepatitis C. Sexually transmitted infections (STIs)  Get screened for STIs, including gonorrhea and chlamydia, if: ? You are sexually active and are younger than 44 years of age. ? You are older than 44 years of age and your health care provider tells you that you are at risk for this type of infection. ? Your sexual activity has changed since you were last screened, and you are at increased risk for chlamydia or gonorrhea. Ask your health care provider   if you are at risk.  Ask your health care provider about whether you are at high risk for HIV. Your health care provider may recommend a prescription medicine to help prevent HIV infection. If you choose to take medicine to prevent HIV, you should first get tested for HIV. You should then be tested every 3 months for as long as you are taking the medicine. Pregnancy  If you are about to stop having your period (premenopausal) and  you may become pregnant, seek counseling before you get pregnant.  Take 400 to 800 micrograms (mcg) of folic acid every day if you become pregnant.  Ask for birth control (contraception) if you want to prevent pregnancy. Osteoporosis and menopause Osteoporosis is a disease in which the bones lose minerals and strength with aging. This can result in bone fractures. If you are 65 years old or older, or if you are at risk for osteoporosis and fractures, ask your health care provider if you should:  Be screened for bone loss.  Take a calcium or vitamin D supplement to lower your risk of fractures.  Be given hormone replacement therapy (HRT) to treat symptoms of menopause. Follow these instructions at home: Lifestyle  Do not use any products that contain nicotine or tobacco, such as cigarettes, e-cigarettes, and chewing tobacco. If you need help quitting, ask your health care provider.  Do not use street drugs.  Do not share needles.  Ask your health care provider for help if you need support or information about quitting drugs. Alcohol use  Do not drink alcohol if: ? Your health care provider tells you not to drink. ? You are pregnant, may be pregnant, or are planning to become pregnant.  If you drink alcohol: ? Limit how much you use to 0-1 drink a day. ? Limit intake if you are breastfeeding.  Be aware of how much alcohol is in your drink. In the [place] one drink equals one 12 oz bottle of beer (355 mL), one 5 oz glass of wine (148 mL), or one 1 oz glass of hard liquor (44 mL). General instructions  Schedule regular health, dental, and eye exams.  Stay current with your vaccines.  Tell your health care provider if: ? You often feel depressed. ? You have ever been abused or do not feel safe at home. Summary  Adopting a healthy lifestyle and getting preventive care are important in promoting health and wellness.  Follow your health care provider's instructions about healthy  diet, exercising, and getting tested or screened for diseases.  Follow your health care provider's instructions on monitoring your cholesterol and blood pressure. This information is not intended to replace advice given to you by your health care provider. Make sure you discuss any questions you have with your health care provider. Document Revised: 09/26/2018 Document Reviewed: 09/26/2018 Elsevier Patient Education  2021 [employer]  

## 2020-11-30 NOTE — Progress Notes (Signed)
This visit occurred during the SARS-CoV-2 public health emergency.  Safety protocols were in place, including screening questions prior to the visit, additional usage of staff PPE, and extensive cleaning of exam room while observing appropriate contact time as indicated for disinfecting solutions.  Subjective:     Patient ID: Kelly Charles , female    DOB: 1976/06/19 , 45 y.o.   MRN: [id number]   Chief Complaint  Patient presents with  . Annual Exam    HPI  She is here today for a full physical examination. She is not followed by GYN. She has no specific concerns or complaints at this time.     Past Medical History:  Diagnosis Date  . Diabetes mellitus without complication (HCC)   . Trigeminal neuralgia   . Trigeminal neuralgia   . Trigeminal neuralgia      Family History  Problem Relation Age of Onset  . Cancer Maternal Grandfather        BLADDER  . Cancer Father        PROSTATE  . Diabetes Father   . Diabetes Mother   . Mental illness Mother        ANXIETY  . Aneurysm Mother        Small cerebral aneurysm  . Thyroid disease Maternal Aunt   . Cancer Maternal Aunt        LUNG  . Hypertension Maternal Uncle   . Diabetes Maternal Uncle   . Hypertension Maternal Aunt   . Diabetes Maternal Aunt   . Mental illness Maternal Aunt   . Breast cancer Maternal Aunt      Current Outpatient Medications:  .  busPIRone (BUSPAR) 10 MG tablet, Take 1 tablet (10 mg total) by mouth 3 (three) times daily., Disp: 30 tablet, Rfl: 2 .  DULoxetine (CYMBALTA) 60 MG capsule, TAKE 1 CAPSULE BY MOUTH EVERY DAY, Disp: 90 capsule, Rfl: 1 .  Eszopiclone 3 MG TABS, TAKE 1 TABLET BY MOUTH EVERY DAY AT BEDTIME AS NEEDED FOR SLEEP, Disp: 30 tablet, Rfl: 2 .  metFORMIN (GLUCOPHAGE) 1000 MG tablet, TAKE 1 TABLET BY MOUTH EVERY DAY WITH BREAKFAST, Disp: 90 tablet, Rfl: 1   Allergies  Allergen Reactions  . Levaquin [Levofloxacin] Anaphylaxis    Hives, dyspepsia, shortness of breath   . Penicillins Rash      The patient states she uses tubal ligation for birth control. Last LMP was Patient's last menstrual period was 11/07/2020.. Negative for Dysmenorrhea. Negative for: breast discharge, breast lump(s), breast pain and breast self exam. Associated symptoms include abnormal vaginal bleeding. Pertinent negatives include abnormal bleeding (hematology), anxiety, decreased libido, depression, difficulty falling sleep, dyspareunia, history of infertility, nocturia, sexual dysfunction, sleep disturbances, urinary incontinence, urinary urgency, vaginal discharge and vaginal itching. Diet regular.The patient states her exercise level is  intermittent.   . The patient's tobacco use is:  Social History   Tobacco Use  Smoking Status Never Smoker  Smokeless Tobacco Never Used  . She has been exposed to passive smoke. The patient's alcohol use is:  Social History   Substance and Sexual Activity  Alcohol Use No   Review of Systems  Constitutional: Negative.   HENT: Negative.   Eyes: Negative.   Respiratory: Negative.   Cardiovascular: Negative.   Endocrine: Negative.   Genitourinary: Negative.   Musculoskeletal: Negative.   Skin: Negative.   Allergic/Immunologic: Negative.   Neurological: Positive for dizziness and headaches.       She reports she has been having headaches recently.  She admits that she doesn't drink enough water. She admits that is under some stress.   Hematological: Negative.   Psychiatric/Behavioral: Positive for sleep disturbance. The patient is nervous/anxious.        She was started on buspar by Lolita Cram, DNP. She has not had any issues with the medication.      Today's Vitals   11/30/20 0904  BP: 112/78  Pulse: 97  Temp: 97.6 F (36.4 C)  TempSrc: Oral  Weight: 230 lb (104.3 kg)  Height: 5' 5.4" (1.661 m)   Body mass index is 37.81 kg/m.  Wt Readings from Last 3 Encounters:  11/30/20 230 lb (104.3 kg)  10/15/20 230 lb (104.3 kg)  11/27/19  236 lb 12.8 oz (107.4 kg)   Objective:  Physical Exam Vitals and nursing note reviewed.  Constitutional:      Appearance: Normal appearance. She is obese.  HENT:     Head: Normocephalic and atraumatic.     Right Ear: Tympanic membrane, ear canal and external ear normal.     Left Ear: Tympanic membrane, ear canal and external ear normal.     Nose:     Comments: Masked     Mouth/Throat:     Pharynx: Oropharynx is clear.     Comments: Masked  Eyes:     Extraocular Movements: Extraocular movements intact.     Conjunctiva/sclera: Conjunctivae normal.     Pupils: Pupils are equal, round, and reactive to light.  Cardiovascular:     Rate and Rhythm: Normal rate and regular rhythm.     Pulses: Normal pulses.     Heart sounds: Normal heart sounds.  Pulmonary:     Effort: Pulmonary effort is normal.     Breath sounds: Normal breath sounds.  Chest:  Breasts:     Tanner Score is 5.     Right: Normal.     Left: Normal.    Abdominal:     General: Bowel sounds are normal.     Palpations: Abdomen is soft.     Comments: Obese, soft  Genitourinary:    Comments: deferred Musculoskeletal:        General: Normal range of motion.     Cervical back: Normal range of motion and neck supple.  Skin:    General: Skin is warm and dry.     Comments: Keloid posterior scalp  Neurological:     General: No focal deficit present.     Mental Status: She is alert and oriented to person, place, and time.  Psychiatric:        Mood and Affect: Mood normal.        Behavior: Behavior normal.         Assessment And Plan:     1. Routine general medical examination at health care facility Comments: A full exam was performed. Importance of monthly self breast exams was discussed with the patient. PATIENT IS ADVISED TO GET 30-45 MINUTES REGULAR EXERCISE NO LESS THAN FOUR TO FIVE DAYS PER WEEK - BOTH WEIGHTBEARING EXERCISES AND AEROBIC ARE RECOMMENDED.  PATIENT IS ADVISED TO FOLLOW A HEALTHY DIET WITH AT  LEAST SIX FRUITS/VEGGIES PER DAY, DECREASE INTAKE OF RED MEAT, AND TO INCREASE FISH INTAKE TO TWO DAYS PER WEEK.  MEATS/FISH SHOULD NOT BE FRIED, BAKED OR BROILED IS PREFERABLE.  I SUGGEST WEARING SPF 50 SUNSCREEN ON EXPOSED PARTS AND ESPECIALLY WHEN IN THE DIRECT SUNLIGHT FOR AN EXTENDED PERIOD OF TIME.  PLEASE AVOID FAST FOOD RESTAURANTS AND INCREASE YOUR WATER INTAKE.  -  Lipid panel  2. Other abnormal glucose Comments: Her a1c has been elevated in the past. I will recheck this today. She is encouraged to limit her intake of sugary beverages, including diet. She will c/w metformin for now.  - POCT Urinalysis Dipstick (81002) - Hemoglobin A1c  3. Anxiety Comments: She is encouraged to incorporate more stress management techniques. I will refill buspar for her as well. She is encouraged to resume Mg supplementation. Pt advised to only take buspar when needed, I am hopeful that the magnesium supplementation will help with her symptoms. She also agrees to resume therapy. Continued use of buspar BID along with duloxetine could lead to serotonin syndrome, prn dosing is stressed. She will f/u in six months for re-evaluation.   4. Vitamin D deficiency disease Comments: I will check a vitamin D level and supplement as needed.  - VITAMIN D 25 Hydroxy (Vit-D Deficiency, Fractures)  5. Class 2 obesity due to excess calories without serious comorbidity with body mass index (BMI) of 37.0 to 37.9 in adult Comments: She is encouraged to strive for BMI less than 30 to decrease cardiac risk. Advised to aim for at least 150 minutes of exercise per week.   Patient was given opportunity to ask questions. Patient verbalized understanding of the plan and was able to repeat key elements of the plan. All questions were answered to their satisfaction.  Gwynneth Aliment, MD   I, Gwynneth Aliment, MD, have reviewed all documentation for this visit. The documentation on 11/30/20 for the exam, diagnosis, procedures, and  orders are all accurate and complete.  THE PATIENT IS ENCOURAGED TO PRACTICE SOCIAL DISTANCING DUE TO THE COVID-19 PANDEMIC.

## 2020-12-01 LAB — LIPID PANEL
Chol/HDL Ratio: 3.7 ratio (ref 0.0–4.4)
Cholesterol, Total: 224 mg/dL — ABNORMAL HIGH (ref 100–199)
HDL: 60 mg/dL (ref >39)
LDL Chol Calc (NIH): 137 mg/dL — ABNORMAL HIGH (ref 0–99)
Triglycerides: 155 mg/dL — ABNORMAL HIGH (ref 0–149)
VLDL Cholesterol Cal: 27 mg/dL (ref 5–40)

## 2020-12-01 LAB — VITAMIN D 25 HYDROXY (VIT D DEFICIENCY, FRACTURES): Vit D, 25-Hydroxy: 22 ng/mL — ABNORMAL LOW (ref 30.0–100.0)

## 2020-12-01 LAB — HEMOGLOBIN A1C
Est. average glucose Bld gHb Est-mCnc: 128 mg/dL
Hgb A1c MFr Bld: 6.1 % — ABNORMAL HIGH (ref 4.8–5.6)

## 2020-12-03 ENCOUNTER — Other Ambulatory Visit: Payer: Self-pay | Admitting: Internal Medicine

## 2020-12-03 MED ORDER — VITAMIN D (ERGOCALCIFEROL) 1.25 MG (50000 UNIT) PO CAPS: 50000.0000 [IU] | ORAL_CAPSULE | ORAL | 1 refills | Status: DC

## 2020-12-11 ENCOUNTER — Other Ambulatory Visit: Payer: Self-pay | Admitting: Nurse Practitioner

## 2020-12-11 ENCOUNTER — Other Ambulatory Visit: Payer: Self-pay | Admitting: Internal Medicine

## 2020-12-11 DIAGNOSIS — F3289 Other specified depressive episodes: Secondary | ICD-10-CM

## 2020-12-11 DIAGNOSIS — F419 Anxiety disorder, unspecified: Secondary | ICD-10-CM

## 2021-01-13 ENCOUNTER — Other Ambulatory Visit: Payer: Self-pay | Admitting: Nurse Practitioner

## 2021-01-13 DIAGNOSIS — F5101 Primary insomnia: Secondary | ICD-10-CM

## 2021-01-14 NOTE — Telephone Encounter (Signed)
lunesta

## 2021-02-14 ENCOUNTER — Other Ambulatory Visit: Payer: Self-pay | Admitting: Internal Medicine

## 2021-03-28 ENCOUNTER — Other Ambulatory Visit: Payer: Self-pay | Admitting: Nurse Practitioner

## 2021-03-28 DIAGNOSIS — F3289 Other specified depressive episodes: Secondary | ICD-10-CM

## 2021-03-28 DIAGNOSIS — F419 Anxiety disorder, unspecified: Secondary | ICD-10-CM

## 2021-05-14 ENCOUNTER — Other Ambulatory Visit: Payer: Self-pay | Admitting: Nurse Practitioner

## 2021-05-14 DIAGNOSIS — F5101 Primary insomnia: Secondary | ICD-10-CM

## 2021-05-17 ENCOUNTER — Encounter: Payer: Self-pay | Admitting: Internal Medicine

## 2021-05-18 ENCOUNTER — Other Ambulatory Visit: Payer: Self-pay

## 2021-05-18 ENCOUNTER — Other Ambulatory Visit: Payer: Self-pay | Admitting: Internal Medicine

## 2021-05-18 DIAGNOSIS — F5101 Primary insomnia: Secondary | ICD-10-CM

## 2021-05-18 MED ORDER — ESZOPICLONE 3 MG PO TABS: ORAL_TABLET | ORAL | 3 refills | Status: DC

## 2021-05-31 ENCOUNTER — Ambulatory Visit: Payer: 59 | Admitting: Internal Medicine

## 2021-06-03 ENCOUNTER — Other Ambulatory Visit: Payer: Self-pay | Admitting: Internal Medicine

## 2021-06-07 ENCOUNTER — Encounter: Payer: Self-pay | Admitting: Internal Medicine

## 2021-06-07 ENCOUNTER — Ambulatory Visit (INDEPENDENT_AMBULATORY_CARE_PROVIDER_SITE_OTHER): Payer: 59 | Admitting: Internal Medicine

## 2021-06-07 ENCOUNTER — Other Ambulatory Visit: Payer: Self-pay

## 2021-06-07 VITALS — BP 124/68 | HR 94 | Temp 98.7°F | Ht 65.6 in | Wt 224.8 lb

## 2021-06-07 DIAGNOSIS — F419 Anxiety disorder, unspecified: Secondary | ICD-10-CM | POA: Diagnosis not present

## 2021-06-07 DIAGNOSIS — F5101 Primary insomnia: Secondary | ICD-10-CM | POA: Diagnosis not present

## 2021-06-07 DIAGNOSIS — E559 Vitamin D deficiency, unspecified: Secondary | ICD-10-CM

## 2021-06-07 DIAGNOSIS — R7303 Prediabetes: Secondary | ICD-10-CM | POA: Diagnosis not present

## 2021-06-07 DIAGNOSIS — E6609 Other obesity due to excess calories: Secondary | ICD-10-CM

## 2021-06-07 DIAGNOSIS — Z6836 Body mass index (BMI) 36.0-36.9, adult: Secondary | ICD-10-CM

## 2021-06-07 MED ORDER — BUSPIRONE HCL 10 MG PO TABS: 10.0000 mg | ORAL_TABLET | Freq: Two times a day (BID) | ORAL | 2 refills | Status: DC

## 2021-06-07 NOTE — Progress Notes (Signed)
I,[name],acting as a Education administrator for Maximino Greenland, MD.,have documented all relevant documentation on the behalf of Maximino Greenland, MD,as directed by  Maximino Greenland, MD while in the presence of Maximino Greenland, MD.  This visit occurred during the SARS-CoV-2 public health emergency.  Safety protocols were in place, including screening questions prior to the visit, additional usage of staff PPE, and extensive cleaning of exam room while observing appropriate contact time as indicated for disinfecting solutions.  Subjective:     Patient ID: Kelly Charles , female    DOB: 03-08-1976 , 45 y.o.   MRN: [id number]   Chief Complaint  Patient presents with   Insomnia     HPI  Patient is here for follow up for insomnia and prediabetes. She has been taking Lunesta nightly without any issues. She reports compliance with metformin to address the prediabetes.     Past Medical History:  Diagnosis Date   Diabetes mellitus without complication (HCC)    Trigeminal neuralgia    Trigeminal neuralgia    Trigeminal neuralgia      Family History  Problem Relation Age of Onset   Cancer Maternal Grandfather        BLADDER   Cancer Father        PROSTATE   Diabetes Father    Diabetes Mother    Mental illness Mother        ANXIETY   Aneurysm Mother        Small cerebral aneurysm   Thyroid disease Maternal Aunt    Cancer Maternal Aunt        LUNG   Hypertension Maternal Uncle    Diabetes Maternal Uncle    Hypertension Maternal Aunt    Diabetes Maternal Aunt    Mental illness Maternal Aunt    Breast cancer Maternal Aunt      Current Outpatient Medications:    DULoxetine (CYMBALTA) 60 MG capsule, TAKE 1 CAPSULE BY MOUTH EVERY DAY, Disp: 90 capsule, Rfl: 1   Eszopiclone 3 MG TABS, TAKE 1 TABLET BY MOUTH AT BEDTIME AS NEEDED FOR SLEEP, Disp: 30 tablet, Rfl: 3   metFORMIN (GLUCOPHAGE) 1000 MG tablet, TAKE 1 TABLET BY MOUTH EVERY DAY WITH BREAKFAST, Disp: 90 tablet, Rfl:  1   Vitamin D, Ergocalciferol, (DRISDOL) 1.25 MG (50000 UNIT) CAPS capsule, Take 1 capsule (50,000 Units total) by mouth every 7 (seven) days., Disp: 24 capsule, Rfl: 1   busPIRone (BUSPAR) 10 MG tablet, Take 1 tablet (10 mg total) by mouth 2 (two) times daily., Disp: 180 tablet, Rfl: 2   Allergies  Allergen Reactions   Levaquin [Levofloxacin] Anaphylaxis    Hives, dyspepsia, shortness of breath   Penicillins Rash     Review of Systems  Constitutional: Negative.   Respiratory: Negative.    Cardiovascular: Negative.   Gastrointestinal: Negative.   Neurological: Negative.   Psychiatric/Behavioral:  Positive for sleep disturbance. The patient is nervous/anxious (states she feels overwhelmed w/ life. Wants to adjust dose of buspar. Started to take twice daily in the past week.).     Today's Vitals   06/07/21 1225  BP: 124/68  Pulse: 94  Temp: 98.7 F (37.1 C)  TempSrc: Oral  Weight: 224 lb 12.8 oz (102 kg)  Height: 5' 5.6" (1.666 m)   Body mass index is 36.73 kg/m.  Wt Readings from Last 3 Encounters:  06/07/21 224 lb 12.8 oz (102 kg)  11/30/20 230 lb (104.3 kg)  10/15/20 230 lb (104.3 kg)  Objective:  Physical Exam Vitals and nursing note reviewed.  Constitutional:      Appearance: Normal appearance. She is obese.  HENT:     Head: Normocephalic and atraumatic.     Nose:     Comments: Masked     Mouth/Throat:     Comments: Masked   Cardiovascular:     Rate and Rhythm: Normal rate and regular rhythm.     Heart sounds: Normal heart sounds.  Pulmonary:     Effort: Pulmonary effort is normal.     Breath sounds: Normal breath sounds.  Musculoskeletal:     Cervical back: Normal range of motion.  Skin:    General: Skin is warm.  Neurological:     General: No focal deficit present.     Mental Status: She is alert.  Psychiatric:        Mood and Affect: Mood normal.        Behavior: Behavior normal.        Assessment And Plan:     1. Primary  insomnia Comments: Chronic, controlled with Lunesta nightly prn. Reminded to have good bedtime routine.   2. Prediabetes Comments: I will check labs as listed below. Encouraged to limit her intake of sweetened beverages and refined carbs.  - Hemoglobin A1c - Insulin, random(561) - BMP8+eGFR  3. Vitamin D deficiency disease Comments: I will check a vitamin D level and supplement as needed.  - Vitamin D (25 hydroxy)  4. Anxiety Comments: I will increase buspar to twice daily. She also agrees to [facility] group referral for therapy. She will f/u in six weeks. If needed, will consider increasing duloxetine at her next visit if needed.  - busPIRone (BUSPAR) 10 MG tablet; Take 1 tablet (10 mg total) by mouth 2 (two) times daily.  Dispense: 180 tablet; Refill: 2  5. Class 2 obesity due to excess calories without serious comorbidity with body mass index (BMI) of 36.0 to 36.9 in adult Comments: She is congratulated on her 6 pound weight loss. Encouraged to aim for at least 150 minutes of exercise per week.   Patient was given opportunity to ask questions. Patient verbalized understanding of the plan and was able to repeat key elements of the plan. All questions were answered to their satisfaction.   I, Maximino Greenland, MD, have reviewed all documentation for this visit. The documentation on 06/07/21 for the exam, diagnosis, procedures, and orders are all accurate and complete.   IF YOU HAVE BEEN REFERRED TO A SPECIALIST, IT MAY TAKE 1-2 WEEKS TO SCHEDULE/PROCESS THE REFERRAL. IF YOU HAVE NOT HEARD FROM US/SPECIALIST IN TWO WEEKS, PLEASE GIVE [place] A CALL AT [phone].   THE PATIENT IS ENCOURAGED TO PRACTICE SOCIAL DISTANCING DUE TO THE COVID-19 PANDEMIC.

## 2021-06-07 NOTE — Patient Instructions (Signed)
Preventing Type 2 Diabetes Mellitus Type 2 diabetes, also called type 2 diabetes mellitus, is a long-term (chronic) disease that affects sugar (glucose) levels in your blood. Normally, a hormone called insulin allows glucose to enter cells in your body. The cells use glucose for energy. With type 2 diabetes, you will have one or both of these problems: Your pancreas does not make enough insulin. Cells in your body do not respond properly to insulin that your body makes (insulin resistance). Insulin resistance or lack of insulin causes extra glucose to build up in the blood instead of going into cells. As a result, high blood glucose (hyperglycemia) develops. That can cause many complications. Being overweight or obese and having an inactive (sedentary) lifestyle can increase your risk for diabetes. Type 2 diabetes can be delayedor prevented by making certain nutrition and lifestyle changes. How can this condition affect me? If you do not take steps to prevent diabetes, your blood glucose levels may keep increasing over time. Too much glucose in your blood for a long time candamage your blood vessels, heart, kidneys, nerves, and eyes. Type 2 diabetes can lead to chronic health problems and complications, such as: Heart disease. Stroke. Blindness. Kidney disease. Depression. Poor circulation in your feet and legs. In severe cases, a foot or leg may need to be surgically removed (amputated). What can increase my risk? You may be more likely to develop type 2 diabetes if you: Have type 2 diabetes in your family. Are overweight or obese. Have a sedentary lifestyle. Have insulin resistance or a history of prediabetes. Have a history of pregnancy-related (gestational) diabetes or polycystic ovary syndrome (PCOS). What actions can I take to prevent this? It can be difficult to recognize signs of type 2 diabetes. Taking action to prevent the disease before you develop symptoms is the best way to avoid  possible damage to your body. Making certain nutrition and lifestyle changesmay prevent or delay the disease and related health problems. Nutrition  Eat healthy meals and snacks regularly. Do not skip meals. Fruit or a handful of nuts is a healthy snack between meals. Drink water throughout the day. Avoid drinks that contain added sugar, such as soda or sweetened tea. Drink enough fluid to keep your urine pale yellow. Follow instructions from your health care provider about eating or drinking restrictions. Limit the amount of food you eat by: Controlling how much you eat at a time (portion size). Checking food labels for the serving sizes of food. Using a kitchen scale to weigh amounts of food. Saut or steam food instead of frying it. Cook with water or broth instead of oils or butter. Limit saturated fat and salt (sodium) in your diet. Have no more than 1 tsp (2,400 mg) of sodium a day. If you have heart disease or high blood pressure, use less than ? tsp (1,500 mg) of sodium a day.  Lifestyle  Lose weight if needed and as told. Your health care provider can determine how much weight loss is best for you and can help you lose weight safely. If you are overweight or obese, you may be told to lose at least 5?7% of your body weight. Manage blood pressure, cholesterol, and stress. Your health care provider will help determine the best treatment for you. Do not use any products that contain nicotine or tobacco, such as cigarettes, e-cigarettes, and chewing tobacco. If you need help quitting, ask your health care provider.  Activity  Do physical activity that makes your heart beat   faster and makes you sweat (moderate intensity). Do this for at least 30 minutes on at least 5 days of the week, or as much as told by your health care provider. Ask your health care provider what activities are safe for you. A mix of activities may be best, such as walking, swimming, cycling, and strength  training. Try to add physical activity into your day. For example: Park your car farther away than usual so that you walk more. Take a walk during your lunch break. Use stairs instead of elevators or escalators. Walk or bike to work instead of driving.  Alcohol use If you drink alcohol: Limit how much you use to: 0?1 drink a day for women who are not pregnant. 0?2 drinks a day for men. Be aware of how much alcohol is in your drink. In the [place] one drink equals one 12 oz bottle of beer (355 mL), one 5 oz glass of wine (148 mL), or one 1 oz glass of hard liquor (44 mL). General information Talk with your health care provider about your risk factors and how you can reduce your risk for diabetes. Have your blood glucose tested regularly, as told by your health care provider. Get screening tests as told by your health care provider. You may have these regularly, especially if you have certain risk factors for type 2 diabetes. Make an appointment with a registered dietitian. This diet and nutrition specialist can help you make a healthy eating plan and help you understand portion sizes and food labels. Where to find support Ask your health care provider to recommend a registered dietitian, a certified diabetes care and education specialist, or a weight loss program. Look for local or online weight loss groups. Join a gym, fitness club, or outdoor activity group, such as a walking club. Where to find more information To learn more about diabetes and diabetes prevention, visit: American Diabetes Association (ADA): www.diabetes.org National Institute of Diabetes and Digestive and Kidney Diseases: www.niddk.nih.gov To learn more about healthy eating, visit: [place] Department of Agriculture (USDA): www.choosemyplate.gov Office of Disease Prevention and Health Promotion (ODPHP): health.gov Summary You can delay or prevent type 2 diabetes by eating healthy foods, losing weight if needed, and  increasing your physical activity. Talk with your health care provider about your risk factors for type 2 diabetes and how you can reduce your risk. It can be difficult to recognize the signs of type 2 diabetes. The best way to avoid possible damage to your body is to take action to prevent the disease before you develop symptoms. Get screening tests as told by your health care provider. This information is not intended to replace advice given to you by your health care provider. Make sure you discuss any questions you have with your healthcare provider. Document Revised: 09/16/2020 Document Reviewed: 04/29/2020 Elsevier Patient Education  2022 [employer]  

## 2021-06-08 ENCOUNTER — Encounter: Payer: Self-pay | Admitting: Internal Medicine

## 2021-06-08 LAB — HEMOGLOBIN A1C
Est. average glucose Bld gHb Est-mCnc: 128 mg/dL
Hgb A1c MFr Bld: 6.1 % — ABNORMAL HIGH (ref 4.8–5.6)

## 2021-06-08 LAB — BMP8+EGFR
BUN/Creatinine Ratio: 12 (ref 9–23)
BUN: 9 mg/dL (ref 6–24)
CO2: 18 mmol/L — ABNORMAL LOW (ref 20–29)
Calcium: 9.3 mg/dL (ref 8.7–10.2)
Chloride: 102 mmol/L (ref 96–106)
Creatinine, Ser: 0.77 mg/dL (ref 0.57–1.00)
Glucose: 76 mg/dL (ref 65–99)
Potassium: 4.7 mmol/L (ref 3.5–5.2)
Sodium: 138 mmol/L (ref 134–144)
eGFR: 98 mL/min/{1.73_m2} (ref >59)

## 2021-06-08 LAB — INSULIN, RANDOM: INSULIN: 8.9 u[IU]/mL (ref 2.6–24.9)

## 2021-06-08 LAB — VITAMIN D 25 HYDROXY (VIT D DEFICIENCY, FRACTURES): Vit D, 25-Hydroxy: 39.9 ng/mL (ref 30.0–100.0)

## 2021-06-09 ENCOUNTER — Encounter: Payer: Self-pay | Admitting: Internal Medicine

## 2021-07-19 ENCOUNTER — Other Ambulatory Visit: Payer: Self-pay

## 2021-07-19 ENCOUNTER — Ambulatory Visit (INDEPENDENT_AMBULATORY_CARE_PROVIDER_SITE_OTHER): Payer: 59 | Admitting: Internal Medicine

## 2021-07-19 ENCOUNTER — Encounter: Payer: Self-pay | Admitting: Internal Medicine

## 2021-07-19 VITALS — BP 120/76 | HR 115 | Temp 98.1°F | Ht 65.8 in | Wt 220.6 lb

## 2021-07-19 DIAGNOSIS — M25531 Pain in right wrist: Secondary | ICD-10-CM | POA: Diagnosis not present

## 2021-07-19 DIAGNOSIS — R000 Tachycardia, unspecified: Secondary | ICD-10-CM

## 2021-07-19 DIAGNOSIS — Z23 Encounter for immunization: Secondary | ICD-10-CM | POA: Diagnosis not present

## 2021-07-19 DIAGNOSIS — Z6835 Body mass index (BMI) 35.0-35.9, adult: Secondary | ICD-10-CM

## 2021-07-19 DIAGNOSIS — F419 Anxiety disorder, unspecified: Secondary | ICD-10-CM | POA: Diagnosis not present

## 2021-07-19 NOTE — Progress Notes (Signed)
Jeri Cos [place],acting as a Neurosurgeon for Gwynneth Aliment, MD.,have documented all relevant documentation on the behalf of Gwynneth Aliment, MD,as directed by  Gwynneth Aliment, MD while in the presence of Gwynneth Aliment, MD.  This visit occurred during the SARS-CoV-2 public health emergency.  Safety protocols were in place, including screening questions prior to the visit, additional usage of staff PPE, and extensive cleaning of exam room while observing appropriate contact time as indicated for disinfecting solutions.  Subjective:     Patient ID: Kelly Charles , female    DOB: October 27, 1975 , 45 y.o.   MRN: [id number]   Chief Complaint  Patient presents with   Anxiety    HPI  Patient presents today for a f/u on her anxiety. She stated she does not feel like the buspar is doing what it needs to for her. She feels "blah". She has no new concerns at this time.   Anxiety Presents for follow-up visit. Symptoms include nervous/anxious behavior and palpitations. Patient reports no compulsions, feeling of choking, muscle tension or nausea.      Past Medical History:  Diagnosis Date   Diabetes mellitus without complication (HCC)    Trigeminal neuralgia    Trigeminal neuralgia    Trigeminal neuralgia      Family History  Problem Relation Age of Onset   Cancer Maternal Grandfather        BLADDER   Cancer Father        PROSTATE   Diabetes Father    Diabetes Mother    Mental illness Mother        ANXIETY   Aneurysm Mother        Small cerebral aneurysm   Thyroid disease Maternal Aunt    Cancer Maternal Aunt        LUNG   Hypertension Maternal Uncle    Diabetes Maternal Uncle    Hypertension Maternal Aunt    Diabetes Maternal Aunt    Mental illness Maternal Aunt    Breast cancer Maternal Aunt      Current Outpatient Medications:    busPIRone (BUSPAR) 10 MG tablet, Take 1 tablet (10 mg total) by mouth 2 (two) times daily., Disp: 180 tablet, Rfl: 2   DULoxetine  (CYMBALTA) 60 MG capsule, TAKE 1 CAPSULE BY MOUTH EVERY DAY, Disp: 90 capsule, Rfl: 1   Eszopiclone 3 MG TABS, TAKE 1 TABLET BY MOUTH AT BEDTIME AS NEEDED FOR SLEEP, Disp: 30 tablet, Rfl: 3   metFORMIN (GLUCOPHAGE) 1000 MG tablet, TAKE 1 TABLET BY MOUTH EVERY DAY WITH BREAKFAST, Disp: 90 tablet, Rfl: 1   Vitamin D, Ergocalciferol, (DRISDOL) 1.25 MG (50000 UNIT) CAPS capsule, Take 1 capsule (50,000 Units total) by mouth every 7 (seven) days., Disp: 24 capsule, Rfl: 1   Allergies  Allergen Reactions   Levaquin [Levofloxacin] Anaphylaxis    Hives, dyspepsia, shortness of breath   Penicillins Rash     Review of Systems  Constitutional: Negative.   Respiratory: Negative.    Cardiovascular:  Positive for palpitations.  Gastrointestinal: Negative.  Negative for nausea.  Musculoskeletal:  Positive for arthralgias.       She c/o r wrist pain. Denies fall/trauma. Not sure what has triggered her pain. Described as dull, throbbing pain.   Neurological: Negative.   Psychiatric/Behavioral:  The patient is nervous/anxious.     Today's Vitals   07/19/21 1428  BP: 120/76  Pulse: (!) 115  Temp: 98.1 F (36.7 C)  Weight: 220 lb 9.6 oz (100.1  kg)  Height: 5' 5.8" (1.671 m)  PainSc: 8   PainLoc: Wrist   Body mass index is 35.82 kg/m.  Wt Readings from Last 3 Encounters:  07/19/21 220 lb 9.6 oz (100.1 kg)  06/07/21 224 lb 12.8 oz (102 kg)  11/30/20 230 lb (104.3 kg)     Objective:  Physical Exam Vitals and nursing note reviewed.  Constitutional:      Appearance: Normal appearance.  HENT:     Head: Normocephalic and atraumatic.     Nose:     Comments: Masked     Mouth/Throat:     Comments: Masked  Eyes:     Extraocular Movements: Extraocular movements intact.  Cardiovascular:     Rate and Rhythm: Regular rhythm. Tachycardia present.     Heart sounds: Normal heart sounds.  Pulmonary:     Effort: Pulmonary effort is normal.     Breath sounds: Normal breath sounds.  Musculoskeletal:         General: Tenderness present.     Cervical back: Normal range of motion.     Comments: Neg Phalen's. Pos Tinel's on R  Skin:    General: Skin is warm.  Neurological:     General: No focal deficit present.     Mental Status: She is alert.  Psychiatric:        Mood and Affect: Mood normal.        Behavior: Behavior normal.        Assessment And Plan:     1. Anxiety Comments: No improvement with duloxetine and/or buspar. Will refer her to Psych for further evaluation.  She agrees w/ treatment plan.  - Ambulatory referral to Psychiatry  2. Tachycardia Comments: EKG performed, Sinus tachy. Did not have palpitations until she saw her pulse rate. Likely due to buspar, will decrease dose to 1/2 tab bid. I do plan to wean her off completely. She is advised to limit caffeine. She is encouraged to stay well hydrated. I will check labs as below.  - EKG 12-Lead - TSH + free T4 - Magnesium - CBC no Diff  3. Right wrist pain Comments: Sx suggestive of CTS and tendinitis. She agrees to Neuro referral for NCS/EMG testing. Advised to apply Voltaren gel to affected area tid and wear brace.  - Ambulatory referral to Neurology  4. Class 2 severe obesity due to excess calories with serious comorbidity and body mass index (BMI) of 35.0 to 35.9 in adult [facility]) Comments: She was congratulated on her 4 lb weight loss since August 22. She is encouraged to aim for BMI less than 30 to decrease cardiac risk.   5. Immunization due Comments: She was given flu vaccine.  - Flu Vaccine QUAD 6+ mos PF IM (Fluarix Quad PF)   Patient was given opportunity to ask questions. Patient verbalized understanding of the plan and was able to repeat key elements of the plan. All questions were answered to their satisfaction.   I, Gwynneth Aliment, MD, have reviewed all documentation for this visit. The documentation on 07/19/21 for the exam, diagnosis, procedures, and orders are all accurate and complete.   IF YOU  HAVE BEEN REFERRED TO A SPECIALIST, IT MAY TAKE 1-2 WEEKS TO SCHEDULE/PROCESS THE REFERRAL. IF YOU HAVE NOT HEARD FROM US/SPECIALIST IN TWO WEEKS, PLEASE GIVE [place] A CALL AT [phone].   THE PATIENT IS ENCOURAGED TO PRACTICE SOCIAL DISTANCING DUE TO THE COVID-19 PANDEMIC.

## 2021-07-19 NOTE — Patient Instructions (Signed)

## 2021-07-20 ENCOUNTER — Encounter: Payer: Self-pay | Admitting: Internal Medicine

## 2021-07-20 LAB — CBC
Hematocrit: 33.8 % — ABNORMAL LOW (ref 34.0–46.6)
Hemoglobin: 10.3 g/dL — ABNORMAL LOW (ref 11.1–15.9)
MCH: 21.1 pg — ABNORMAL LOW (ref 26.6–33.0)
MCHC: 30.5 g/dL — ABNORMAL LOW (ref 31.5–35.7)
MCV: 69 fL — ABNORMAL LOW (ref 79–97)
Platelets: 420 10*3/uL (ref 150–450)
RBC: 4.87 x10E6/uL (ref 3.77–5.28)
RDW: 14.8 % (ref 11.7–15.4)
WBC: 6.9 10*3/uL (ref 3.4–10.8)

## 2021-07-20 LAB — MAGNESIUM: Magnesium: 1.9 mg/dL (ref 1.6–2.3)

## 2021-07-20 LAB — TSH+FREE T4
Free T4: 1.25 ng/dL (ref 0.82–1.77)
TSH: 0.332 u[IU]/mL — ABNORMAL LOW (ref 0.450–4.500)

## 2021-07-21 ENCOUNTER — Other Ambulatory Visit: Payer: Self-pay

## 2021-07-21 DIAGNOSIS — R7989 Other specified abnormal findings of blood chemistry: Secondary | ICD-10-CM

## 2021-07-22 LAB — IRON: Iron: 36 ug/dL (ref 27–159)

## 2021-07-22 LAB — SPECIMEN STATUS REPORT

## 2021-07-22 LAB — T3, FREE: T3, Free: 2.7 pg/mL (ref 2.0–4.4)

## 2021-08-09 ENCOUNTER — Ambulatory Visit: Payer: 59 | Attending: Internal Medicine

## 2021-08-09 DIAGNOSIS — Z23 Encounter for immunization: Secondary | ICD-10-CM

## 2021-08-09 NOTE — Progress Notes (Signed)
   Covid-19 Vaccination Clinic  Name:  Kelly Charles    MRN: [id number] DOB: 26-Aug-1976  08/09/2021  [name] was observed post Covid-19 immunization for 30 minutes based on pre-vaccination screening without incident. She was provided with Vaccine Information Sheet and instruction to access the V-Safe system.   [name] was instructed to call 911 with any severe reactions post vaccine: Difficulty breathing  Swelling of face and throat  A fast heartbeat  A bad rash all over body  Dizziness and weakness   Immunizations Administered     Name Date Dose VIS Date Route   Pfizer Covid-19 Vaccine Bivalent Booster 08/09/2021 11:01 AM 0.3 mL 06/16/2021 Intramuscular   Manufacturer: [employer], [employer]   Lot: [id number]   NDC: [phone]

## 2021-08-11 ENCOUNTER — Encounter (HOSPITAL_COMMUNITY): Payer: Self-pay | Admitting: *Deleted

## 2021-08-11 ENCOUNTER — Emergency Department (HOSPITAL_COMMUNITY): Payer: 59

## 2021-08-11 ENCOUNTER — Other Ambulatory Visit: Payer: Self-pay

## 2021-08-11 ENCOUNTER — Encounter: Payer: Self-pay | Admitting: Internal Medicine

## 2021-08-11 ENCOUNTER — Emergency Department (HOSPITAL_COMMUNITY)
Admission: EM | Admit: 2021-08-11 | Discharge: 2021-08-11 | Disposition: A | Discharge status: Home / Self Care | Payer: 59 | Attending: Emergency Medicine | Admitting: Emergency Medicine

## 2021-08-11 DIAGNOSIS — Z5321 Procedure and treatment not carried out due to patient leaving prior to being seen by health care provider: Secondary | ICD-10-CM | POA: Diagnosis not present

## 2021-08-11 DIAGNOSIS — M542 Cervicalgia: Secondary | ICD-10-CM | POA: Diagnosis not present

## 2021-08-11 DIAGNOSIS — R079 Chest pain, unspecified: Secondary | ICD-10-CM | POA: Diagnosis not present

## 2021-08-11 DIAGNOSIS — M25511 Pain in right shoulder: Secondary | ICD-10-CM | POA: Diagnosis not present

## 2021-08-11 DIAGNOSIS — R42 Dizziness and giddiness: Secondary | ICD-10-CM | POA: Diagnosis not present

## 2021-08-11 DIAGNOSIS — M25512 Pain in left shoulder: Secondary | ICD-10-CM | POA: Insufficient documentation

## 2021-08-11 DIAGNOSIS — R61 Generalized hyperhidrosis: Secondary | ICD-10-CM | POA: Insufficient documentation

## 2021-08-11 LAB — BASIC METABOLIC PANEL
Anion gap: 8 (ref 5–15)
BUN: 8 mg/dL (ref 6–20)
CO2: 22 mmol/L (ref 22–32)
Calcium: 9.2 mg/dL (ref 8.9–10.3)
Chloride: 106 mmol/L (ref 98–111)
Creatinine, Ser: 0.76 mg/dL (ref 0.44–1.00)
GFR, Estimated: >60 mL/min (ref >60)
Glucose, Bld: 83 mg/dL (ref 70–99)
Potassium: 3.9 mmol/L (ref 3.5–5.1)
Sodium: 136 mmol/L (ref 135–145)

## 2021-08-11 LAB — CBC
HCT: 36.6 % (ref 36.0–46.0)
Hemoglobin: 11.2 g/dL — ABNORMAL LOW (ref 12.0–15.0)
MCH: 21.6 pg — ABNORMAL LOW (ref 26.0–34.0)
MCHC: 30.6 g/dL (ref 30.0–36.0)
MCV: 70.7 fL — ABNORMAL LOW (ref 80.0–100.0)
Platelets: 476 10*3/uL — ABNORMAL HIGH (ref 150–400)
RBC: 5.18 MIL/uL — ABNORMAL HIGH (ref 3.87–5.11)
RDW: 15.9 % — ABNORMAL HIGH (ref 11.5–15.5)
WBC: 6 10*3/uL (ref 4.0–10.5)
nRBC: 0 % (ref 0.0–0.2)

## 2021-08-11 LAB — I-STAT BETA HCG BLOOD, ED (MC, WL, AP ONLY): I-stat hCG, quantitative: <5 m[IU]/mL (ref <5)

## 2021-08-11 LAB — TROPONIN I (HIGH SENSITIVITY)
Troponin I (High Sensitivity): <2 ng/L (ref <18)
Troponin I (High Sensitivity): <2 ng/L (ref <18)

## 2021-08-11 NOTE — ED Triage Notes (Signed)
Pt arrived by [facility] from restaurant. Had onset of arm pain into her neck/shoulders and chest. Received 324mg  ASA pta. No acute distress noted at triage.

## 2021-08-11 NOTE — ED Provider Notes (Signed)
Emergency Medicine Provider Triage Evaluation Note  Kelly Charles , a 45 y.o. female  was evaluated in triage.  Pt complains of pain starting at 11 AM this morning.  Patient was eating at a restaurant when she had rapid onset of central chest pain with radiation to bilateral shoulders and bilateral upper arms.  Had associated bilateral neck pain and sweating.  Had stepped outside, and symptoms persisted.  She received 324 mg aspirin via EMS in route.  She states that her pain has subsided.  No personal cardiac history, reports several family members with heart disease/heart attacks.  History of depression anxiety, concerned her symptoms may been related to this as well.  Review of Systems  Positive: Chest pain, lightheadedness, sweating, bilateral shoulder and arm pain Negative: Abdominal pain, nausea, vomiting, shortness of breath, fevers, cough  Physical Exam  BP 136/87 (BP Location: Right Arm)   Pulse (!) 108   Temp 98.8 F (37.1 C)   Resp 18   SpO2 98%  Gen:   Awake, no distress   Resp:  Normal effort  MSK:   Moves extremities without difficulty  Other:  Lungs clear to auscultation, heart regular rate and rhythm  Medical Decision Making  Medically screening exam initiated at 1:26 PM.  Appropriate orders placed.  Kelly Charles was informed that the remainder of the evaluation will be completed by another provider, this initial triage assessment does not replace that evaluation, and the importance of remaining in the ED until their evaluation is complete.     Jeanella Flattery 08/11/21 1328    Margarita Grizzle, MD 08/13/21 [phone]

## 2021-08-11 NOTE — ED Notes (Signed)
Removed EMS IV

## 2021-08-11 NOTE — ED Notes (Signed)
Patient states she is going to follow up with primary care in the morning d/t wait time

## 2021-08-12 ENCOUNTER — Ambulatory Visit (INDEPENDENT_AMBULATORY_CARE_PROVIDER_SITE_OTHER): Payer: 59 | Admitting: Nurse Practitioner

## 2021-08-12 VITALS — BP 118/70 | HR 73 | Temp 98.7°F | Ht 65.0 in | Wt 227.2 lb

## 2021-08-12 DIAGNOSIS — R0789 Other chest pain: Secondary | ICD-10-CM | POA: Diagnosis not present

## 2021-08-12 NOTE — Patient Instructions (Signed)
Nonspecific Chest Pain Chest pain can be caused by many different conditions. Some causes of chest pain can be life-threatening. These will require treatment right away. Serious causes of chest pain include: Heart attack. A tear in the body's main blood vessel. Redness and swelling (inflammation) around your heart. Blood clot in your lungs. Other causes of chest pain may not be so serious. These include: Heartburn. Anxiety or stress. Damage to bones or muscles in your chest. Lung infections. Chest pain can feel like: Pain or discomfort in your chest. Crushing, pressure, aching, or squeezing pain. Burning or tingling. Dull or sharp pain that is worse when you move, cough, or take a deep breath. Pain or discomfort that is also felt in your back, neck, jaw, shoulder, or arm, or pain that spreads to any of these areas. It is hard to know whether your pain is caused by something that is serious or something that is not so serious. So it is important to see your doctor right away if you have chest pain. Follow these instructions at home: Medicines Take over-the-counter and prescription medicines only as told by your doctor. If you were prescribed an antibiotic medicine, take it as told by your doctor. Do not stop taking the antibiotic even if you start to feel better. Lifestyle  Rest as told by your doctor. Do not use any products that contain nicotine or tobacco, such as cigarettes, e-cigarettes, and chewing tobacco. If you need help quitting, ask your doctor. Do not drink alcohol. Make lifestyle changes as told by your doctor. These may include: Getting regular exercise. Ask your doctor what activities are safe for you. Eating a heart-healthy diet. A diet and nutrition specialist (dietitian) can help you to learn healthy eating options. Staying at a healthy weight. Treating diabetes or high blood pressure, if needed. Lowering your stress. Activities such as yoga and relaxation techniques  can help. General instructions Pay attention to any changes in your symptoms. Tell your doctor about them or any new symptoms. Avoid any activities that cause chest pain. Keep all follow-up visits as told by your doctor. This is important. You may need more testing if your chest pain does not go away. Contact a doctor if: Your chest pain does not go away. You feel depressed. You have a fever. Get help right away if: Your chest pain is worse. You have a cough that gets worse, or you cough up blood. You have very bad (severe) pain in your belly (abdomen). You pass out (faint). You have either of these for no clear reason: Sudden chest discomfort. Sudden discomfort in your arms, back, neck, or jaw. You have shortness of breath at any time. You suddenly start to sweat, or your skin gets clammy. You feel sick to your stomach (nauseous). You throw up (vomit). You suddenly feel lightheaded or dizzy. You feel very weak or tired. Your heart starts to beat fast, or it feels like it is skipping beats. These symptoms may be an emergency. Do not wait to see if the symptoms will go away. Get medical help right away. Call your local emergency services (911 in the [place] Do not drive yourself to the hospital. Summary Chest pain can be caused by many different conditions. The cause may be serious and need treatment right away. If you have chest pain, see your doctor right away. Follow your doctor's instructions for taking medicines and making lifestyle changes. Keep all follow-up visits as told by your doctor. This includes visits for any further   testing if your chest pain does not go away. Be sure to know the signs that show that your condition has become worse. Get help right away if you have these symptoms. This information is not intended to replace advice given to you by your health care provider. Make sure you discuss any questions you have with your health care provider. Document Revised:  12/17/2020 Document Reviewed: 12/17/2020 Elsevier Patient Education  2022 [employer] Inc.  

## 2021-08-12 NOTE — Progress Notes (Signed)
I,[name],acting as a Neurosurgeon for [employer], FNP.,have documented all relevant documentation on the behalf of Arnette Felts, FNP,as directed by  Arnette Felts, FNP while in the presence of Arnette Felts, FNP.  This visit occurred during the SARS-CoV-2 public health emergency.  Safety protocols were in place, including screening questions prior to the visit, additional usage of staff PPE, and extensive cleaning of exam room while observing appropriate contact time as indicated for disinfecting solutions.  Subjective:     Patient ID: Kelly Charles , female    DOB: 05-23-1976 , 45 y.o.   MRN: [id number]   Chief Complaint  Patient presents with   Chest Pain    HPI  Patient is here for chest pain. She went to the ED for chest pain yesterday while at lunch with her husband and as she was eating she had tightness in her chest and across both arms. She was also sweating. She was given 4 baby aspirin and pain subsided.  She had an EKG, blood work and CXR. Both of her maternal grandmother and grandfather had MI after age 39. Mother with PAD - does have stents, maternal uncle had cardiac bypass.   but after being told she was not emergent level she left after waiting 8 hours. She is not having any pains today.   Chest Pain  This is a new problem. The current episode started yesterday. The onset quality is sudden. The problem occurs constantly. The pain is mild. The quality of the pain is described as tightness. The pain radiates to the right shoulder and left shoulder. Pertinent negatives include no abdominal pain, back pain, cough, malaise/fatigue, nausea, syncope, vomiting or weakness. Associated symptoms comments: She had pressure on her chest as though something was sitting on her chest. Had to work harder to breath. . The pain is aggravated by nothing. Treatments tried: baby aspirin. Risk factors include sedentary lifestyle, lack of exercise and obesity.  Her past medical history  is significant for anxiety/panic attacks.  Pertinent negatives for past medical history include no aneurysm and no sleep apnea.    Past Medical History:  Diagnosis Date   Diabetes mellitus without complication (HCC)    Trigeminal neuralgia    Trigeminal neuralgia    Trigeminal neuralgia      Family History  Problem Relation Age of Onset   Cancer Maternal Grandfather        BLADDER   Cancer Father        PROSTATE   Diabetes Father    Diabetes Mother    Mental illness Mother        ANXIETY   Aneurysm Mother        Small cerebral aneurysm   Thyroid disease Maternal Aunt    Cancer Maternal Aunt        LUNG   Hypertension Maternal Uncle    Diabetes Maternal Uncle    Hypertension Maternal Aunt    Diabetes Maternal Aunt    Mental illness Maternal Aunt    Breast cancer Maternal Aunt      Current Outpatient Medications:    busPIRone (BUSPAR) 10 MG tablet, Take 1 tablet (10 mg total) by mouth 2 (two) times daily. (Patient taking differently: Take 5 mg by mouth 2 (two) times daily.), Disp: 180 tablet, Rfl: 2   DULoxetine (CYMBALTA) 60 MG capsule, TAKE 1 CAPSULE BY MOUTH EVERY DAY, Disp: 90 capsule, Rfl: 1   Eszopiclone 3 MG TABS, TAKE 1 TABLET BY MOUTH AT BEDTIME AS NEEDED FOR  SLEEP, Disp: 30 tablet, Rfl: 3   metFORMIN (GLUCOPHAGE) 1000 MG tablet, TAKE 1 TABLET BY MOUTH EVERY DAY WITH BREAKFAST, Disp: 90 tablet, Rfl: 1   Vitamin D, Ergocalciferol, (DRISDOL) 1.25 MG (50000 UNIT) CAPS capsule, Take 1 capsule (50,000 Units total) by mouth every 7 (seven) days., Disp: 24 capsule, Rfl: 1   Allergies  Allergen Reactions   Levaquin [Levofloxacin] Anaphylaxis    Hives, dyspepsia, shortness of breath   Penicillins Rash     Review of Systems  Constitutional: Negative.  Negative for malaise/fatigue.  Respiratory: Negative.  Negative for cough.   Cardiovascular:  Positive for chest pain. Negative for syncope.  Gastrointestinal: Negative.  Negative for abdominal pain, nausea and  vomiting.  Musculoskeletal:  Negative for back pain.  Neurological: Negative.  Negative for weakness.    Today's Vitals   08/12/21 0947  BP: 118/70  Pulse: 73  Temp: 98.7 F (37.1 C)  TempSrc: Oral  Weight: 227 lb 3.2 oz (103.1 kg)  Height: 5\' 5"  (1.651 m)   Body mass index is 37.81 kg/m.   Objective:  Physical Exam Vitals reviewed.  Constitutional:      General: She is not in acute distress.    Appearance: Normal appearance. She is obese.  Cardiovascular:     Rate and Rhythm: Normal rate and regular rhythm.     Pulses: Normal pulses.     Heart sounds: Normal heart sounds. No murmur heard. Pulmonary:     Effort: Pulmonary effort is normal. No respiratory distress.     Breath sounds: Normal breath sounds.  Skin:    General: Skin is warm and dry.     Capillary Refill: Capillary refill takes less than 2 seconds.  Neurological:     General: No focal deficit present.     Mental Status: She is alert and oriented to person, place, and time.     Cranial Nerves: No cranial nerve deficit.  Psychiatric:        Mood and Affect: Mood normal.        Behavior: Behavior normal.        Thought Content: Thought content normal.        Judgment: Judgment normal.        Assessment And Plan:     1. Chest discomfort Comments: chest pain has resolved. Reviewed labs done in ER no abnormal findings    Patient was given opportunity to ask questions. Patient verbalized understanding of the plan and was able to repeat key elements of the plan. All questions were answered to their satisfaction.  , FNP   I, Arnette Felts, FNP, have reviewed all documentation for this visit. The documentation on 08/12/21 for the exam, diagnosis, procedures, and orders are all accurate and complete.   IF YOU HAVE BEEN REFERRED TO A SPECIALIST, IT MAY TAKE 1-2 WEEKS TO SCHEDULE/PROCESS THE REFERRAL. IF YOU HAVE NOT HEARD FROM US/SPECIALIST IN TWO WEEKS, PLEASE GIVE 08/14/21 A CALL AT [phone] X [phone].    THE PATIENT IS ENCOURAGED TO PRACTICE SOCIAL DISTANCING DUE TO THE COVID-19 PANDEMIC.

## 2021-08-19 ENCOUNTER — Other Ambulatory Visit: Payer: Self-pay | Admitting: Internal Medicine

## 2021-08-19 ENCOUNTER — Other Ambulatory Visit: Payer: Self-pay

## 2021-08-19 ENCOUNTER — Ambulatory Visit: Payer: 59 | Admitting: Neurology

## 2021-08-19 ENCOUNTER — Encounter: Payer: Self-pay | Admitting: Neurology

## 2021-08-19 VITALS — BP 125/78 | HR 96 | Ht 65.0 in | Wt 223.2 lb

## 2021-08-19 DIAGNOSIS — M79644 Pain in right finger(s): Secondary | ICD-10-CM

## 2021-08-19 DIAGNOSIS — M79641 Pain in right hand: Secondary | ICD-10-CM

## 2021-08-19 NOTE — Progress Notes (Signed)
[facility]    Provider:  Dr Lucia Gaskins Requesting Provider: [name], MD Primary Care Provider:  [name], MD  CC:  right wrist pain  HPI:  Kelly Charles is a 45 y.o. female here as requested by [name], MD for possible Carpal Tunnel Syndrome/right wrist pain.  I reviewed Dr. Allyne Gee notes, patient reports arthralgias, complaining of right wrist plain, denies fall trauma, not sure what has triggered her pain, described as dull throbbing pain.  Symptoms suggestive of carpal tunnel syndrome and/or possible tendinitis, referring for nerve conduction/EMG testing, advised to apply Voltaren gel to affected area 3 times daily and wear brace, referred to neurology.  Patient states at least a few months, progressively worsening, feels it in the ventral thumb area, when she was tapped on the wrist felt shooting pain into the fingers, weakness opening jars, numbness and tingling digits 2-4, worse in the mornings when waking and stiff and the first movements are painful in the thumb, worse with moving it, she has tried a number of wrist splints and they don;t work, she tried voltaren, No other focal neurologic deficits, associated symptoms, inciting events or modifiable factors.  Reviewed notes, labs and imaging from outside physicians, which showed: see above  Bmp nml 08/11/2021    CT head 11/08/2010 showed No acute intracranial abnormalities including mass lesion or mass effect, hydrocephalus, extra-axial fluid collection, midline shift, hemorrhage, or acute infarction, large ischemic events (personally reviewed images)    Review of Systems: Patient complains of symptoms per HPI as well as the following symptoms arthritis. Pertinent negatives and positives per HPI. All others negative.   Social History   Socioeconomic History   Marital status: Married    Spouse name: Not on file   Number of children: 2   Years of education: 12   Highest  education level: Not on file  Occupational History    Employer: [employer]  Tobacco Use   Smoking status: Never   Smokeless tobacco: Never  Vaping Use   Vaping Use: Never used  Substance and Sexual Activity   Alcohol use: No   Drug use: No   Sexual activity: Yes    Partners: Male    Birth control/protection: Other-see comments    Comment: BTL  Other Topics Concern   Not on file  Social History Narrative   Caffeine switched to decaf (2 cups daily).  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   Social Determinants of Health   Financial Resource Strain: Not on file  Food Insecurity: Not on file  Transportation Needs: Not on file  Physical Activity: Not on file  Stress: Not on file  Social Connections: Not on file  Intimate Partner Violence: Not on file    Family History  Problem Relation Age of Onset   Cancer Maternal Grandfather        BLADDER   Cancer Father        PROSTATE   Diabetes Father    Diabetes Mother    Mental illness Mother        ANXIETY   Aneurysm Mother        Small cerebral aneurysm   Thyroid disease Maternal Aunt    Cancer Maternal Aunt        LUNG   Hypertension Maternal Uncle    Diabetes Maternal Uncle    Hypertension Maternal Aunt    Diabetes Maternal Aunt    Mental illness Maternal Aunt    Breast cancer Maternal Aunt  Past Medical History:  Diagnosis Date   Diabetes mellitus without complication (HCC)    Trigeminal neuralgia    Trigeminal neuralgia    Trigeminal neuralgia     Patient Active Problem List   Diagnosis Date Noted   History of craniotomy 11/04/2015   Encounter for screening for diabetes mellitus 09/30/2015   Trigeminal neuralgia 02/24/2012    Past Surgical History:  Procedure Laterality Date   CEREBRAL MICROVASCULAR DECOMPRESSION Right 10/01/15   [facility]  Dr. Tempie Donning   TUBAL LIGATION     WISDOM TOOTH EXTRACTION      Current Outpatient Medications  Medication Sig Dispense Refill   busPIRone  (BUSPAR) 10 MG tablet Take 1 tablet (10 mg total) by mouth 2 (two) times daily. (Patient taking differently: Take 5 mg by mouth 2 (two) times daily.) 180 tablet 2   DULoxetine (CYMBALTA) 60 MG capsule TAKE 1 CAPSULE BY MOUTH EVERY DAY 90 capsule 1   Eszopiclone 3 MG TABS TAKE 1 TABLET BY MOUTH AT BEDTIME AS NEEDED FOR SLEEP 30 tablet 3   metFORMIN (GLUCOPHAGE) 1000 MG tablet TAKE 1 TABLET BY MOUTH EVERY DAY WITH BREAKFAST 90 tablet 1   Vitamin D, Ergocalciferol, (DRISDOL) 1.25 MG (50000 UNIT) CAPS capsule Take 1 capsule (50,000 Units total) by mouth every 7 (seven) days. 24 capsule 1   No current facility-administered medications for this visit.    Allergies as of 08/19/2021 - Review Complete 08/19/2021  Allergen Reaction Noted   Levaquin [levofloxacin] Anaphylaxis 01/13/2015   Penicillins Rash 02/24/2012    Vitals: BP 125/78   Pulse 96   Ht 5\' 5"  (1.651 m)   Wt 223 lb 3.2 oz (101.2 kg)   LMP 07/27/2021   BMI 37.14 kg/m  Last Weight:  Wt Readings from Last 1 Encounters:  08/19/21 223 lb 3.2 oz (101.2 kg)   Last Height:   Ht Readings from Last 1 Encounters:  08/19/21 5\' 5"  (1.651 m)     Physical exam: Exam: Gen: NAD, conversant, well nourised, obese, well groomed                     CV: RRR, no MRG. No Carotid Bruits. No peripheral edema, warm, nontender Eyes: Conjunctivae clear without exudates or hemorrhage MSK: pain on palpation at the base of the dorsal thumb radiating to the wrist  Neuro: Detailed Neurologic Exam  Speech:    Speech is normal; fluent and spontaneous with normal comprehension.  Cognition:    The patient is oriented to person, place, and time;     recent and remote memory intact;     language fluent;     normal attention, concentration,     fund of knowledge Cranial Nerves:    The pupils are equal, round, and reactive to light. Pupils too small to visualize fundi. Visual fields are full to finger confrontation. Extraocular movements are intact.  Trigeminal sensation is intact and the muscles of mastication are normal. The face is symmetric. The palate elevates in the midline. Hearing intact. Voice is normal. Shoulder shrug is normal. The tongue has normal motion without fasciculations.   Coordination:    Normal  Gait:  normal.   Motor Observation:    No asymmetry, no atrophy, and no involuntary movements noted. Tone:    Normal muscle tone.    Posture:    Posture is normal. normal erect    Strength:    Strength is V/V in the upper and lower limbs.      Sensation:  intact to LT     Reflex Exam:  DTR's:    Deep tendon reflexes in the upper and lower extremities are symmetrical bilaterally.   Toes:    The toes are equiv bilaterally.   Clonus:    Clonus is absent.    Assessment/Plan:  Pain in the right hand  Pain on palpation at the base of the dorsal thumb radiating to the wrist: Seems more like tenosynovitis but also reports tinglging in the fingers may also have concomitant CTS.  - XR of the right hand to look for arthritic changes - EMG/NCS both arms (+ Mcphalen's maneuver but left > right which is opposite from what would be expected, will test both for CTS). If negative recommend seeing the hand center for arthritc or tendinitis as etiology. Maybe has both.  Orders Placed This Encounter  Procedures   DG Hand Complete Right   NCV with EMG(electromyography)   No orders of the defined types were placed in this encounter.   Cc: [name], MD,  [name], MD  Naomie Dean, MD  [facility] [address] [place], [place] [address]  Phone [phone] Fax [phone]

## 2021-08-19 NOTE — Patient Instructions (Addendum)
XRay - can walk into [address] [facility] between 8-4 EMG/NCS - we will schedule  Tenosynovitis Tenosynovitis is inflammation of a tendon and of the sleeve of tissue that covers the tendon (tendon sheath). A tendon is a cord of tissue that connects muscle to bone. Normally, a tendon slides smoothly inside its tendon sheath. Tenosynovitis limits movement of the tendon and surrounding tissues, which may cause pain and stiffness. Tenosynovitis can affect any tendon and tendon sheath. Commonly affected areas include tendons in the: Wrist. Arm. Hand. Hip. Leg. Foot. Shoulder. What are the causes? The main cause of this condition is wear and tear over time that results in slight tears in the tendon. Other possible causes include: An injury to the tendon or tendon sheath. A disease that causes inflammation in the body. An infection that spreads to the tendon and tendon sheath from a skin wound. An infection in another part of the body that spreads to the tendon and tendon sheath through the blood. What increases the risk? The following factors may make you more likely to develop this condition: Having rheumatoid arthritis, gout, or diabetes. Using IV drugs. Doing physical activities that can cause tendon overuse and stress. Having gonorrhea. What are the signs or symptoms? Symptoms of this condition depend on the cause. Symptoms may include: Pain with movement. Pain when pressing on the tendon and tendon sheath. Swelling. Stiffness. If tenosynovitis is caused by an infection, symptoms may include: Fever. Redness. Warmth. How is this diagnosed? This condition may be diagnosed based on your medical history and a physical exam. You also may have: Blood tests. Imaging tests, such as: MRI. Ultrasound. A sample of fluid removed from inside the tendon sheath to be checked in a lab. How is this treated? Treatment for this condition depends on the cause. If tenosynovitis is  not caused by an infection, treatment may include: Rest. Keeping the tendon in place (immobilization) in a splint, brace, or sling. Taking NSAIDs to reduce pain and swelling. A shot (injection) of medicine to help reduce pain and swelling (steroid). Icing or applying heat to the affected area. Physical therapy. Surgery to release the tendon in the sheath or to repair damage to the tendon or tendon sheath. Surgery may be done if other treatments do not help relieve symptoms. If tenosynovitis is caused by infection, treatment may include antibiotic medicine given through an IV. In some cases, surgery may be needed to drain fluid from the tendon sheath or to remove the tendon sheath. Follow these instructions at home: If you have a splint, brace, or sling:  Wear the splint, brace, or sling as told by your health care provider. Remove it only as told by your health care provider. Loosen the splint, brace, or sling if your fingers or toes tingle, become numb, or turn cold and blue. Keep the splint, brace, or sling clean. If the splint, brace, or sling is not waterproof: Do not let it get wet. Cover it with a watertight covering when you take a bath or shower. Managing pain, stiffness, and swelling  If directed, put ice on the affected area. Put ice in a plastic bag. Place a towel between your skin and the bag. Leave the ice on for 20 minutes, 2-3 times a day. Move the fingers or toes of the affected limb often, if this applies. This can help to reduce stiffness and swelling. If directed, raise (elevate) the affected area above the level of your heart while you are sitting or  lying down. If directed, apply heat to the affected area before you exercise. Use the heat source that your health care provider recommends, such as a moist heat pack or a heating pad. Place a towel between your skin and the heat source. Leave the heat on for 20-30 minutes. Remove the heat if your skin turns bright red.  This is especially important if you are unable to feel pain, heat, or cold. You may have a greater risk of getting burned. Medicines Take over-the-counter and prescription medicines only as told by your health care provider. Ask your health care provider if the medicine prescribed to you: Requires you to avoid driving or using heavy machinery. Can cause constipation. You may need to take actions to prevent or treat constipation, such as: Drink enough fluid to keep your urine pale yellow. Take over-the-counter or prescription medicines. Eat foods that are high in fiber, such as beans, whole grains, and fresh fruits and vegetables. Limit foods that are high in fat and processed sugars, such as fried or sweet foods. Activity Return to your normal activities as told by your health care provider. Ask your health care provider what activities are safe for you. Rest the affected area as told by your health care provider. Avoid using the affected area while you are having symptoms. Do not use the injured limb to support your body weight until your health care provider says that you can. If physical therapy was prescribed, do exercises as told by your health care provider. General instructions Ask your health care provider when it is safe to drive if you have a splint or brace on any part of your arm or leg. Keep all follow-up visits as told by your health care provider. This is important. Contact a health care provider if: Your symptoms are not improving or are getting worse. Get help right away if: Your fingers or toes become numb or turn blue. You have a fever and more of any of the following symptoms: Pain. Redness. Warmth. Swelling. Summary Tenosynovitis is inflammation of a tendon and of the sleeve of tissue that covers the tendon (tendon sheath). Treatment for this condition depends on the cause. Treatment may include rest, medicines, physical therapy, or surgery. Contact a health care  provider if your symptoms are not improving or are getting worse. Keep all follow-up visits as told by your health care provider. This is important. This information is not intended to replace advice given to you by your health care provider. Make sure you discuss any questions you have with your health care provider. Document Revised: 05/24/2018 Document Reviewed: 05/24/2018 Elsevier Patient Education  2022 Elsevier Inc.  Carpal Tunnel Syndrome Carpal tunnel syndrome is a condition that causes pain, numbness, and weakness in your hand and fingers. The carpal tunnel is a narrow area located on the palm side of your wrist. Repeated wrist motion or certain diseases may cause swelling within the tunnel. This swelling pinches the main nerve in the wrist. The main nerve in the wrist is called the median nerve. What are the causes? This condition may be caused by: Repeated and forceful wrist and hand motions. Wrist injuries. Arthritis. A cyst or tumor in the carpal tunnel. Fluid buildup during pregnancy. Use of tools that vibrate. Sometimes the cause of this condition is not known. What increases the risk? The following factors may make you more likely to develop this condition: Having a job that requires you to repeatedly or forcefully move your wrist or hand or  requires you to use tools that vibrate. This may include jobs that involve using computers, working on an [employer], or working with power tools such as Radiographer, therapeutic. Being a woman. Having certain conditions, such as: Diabetes. Obesity. An underactive thyroid (hypothyroidism). Kidney failure. Rheumatoid arthritis. What are the signs or symptoms? Symptoms of this condition include: A tingling feeling in your fingers, especially in your thumb, index, and middle fingers. Tingling or numbness in your hand. An aching feeling in your entire arm, especially when your wrist and elbow are bent for a long time. Wrist pain that goes  up your arm to your shoulder. Pain that goes down into your palm or fingers. A weak feeling in your hands. You may have trouble grabbing and holding items. Your symptoms may feel worse during the night. How is this diagnosed? This condition is diagnosed with a medical history and physical exam. You may also have tests, including: Electromyogram (EMG). This test measures electrical signals sent by your nerves into the muscles. Nerve conduction study. This test measures how well electrical signals pass through your nerves. Imaging tests, such as X-rays, ultrasound, and MRI. These tests check for possible causes of your condition. How is this treated? This condition may be treated with: Lifestyle changes. It is important to stop or change the activity that caused your condition. Doing exercise and activities to strengthen and stretch your muscles and tendons (physical therapy). Making lifestyle changes to help with your condition and learning how to do your daily activities safely (occupational therapy). Medicines for pain and inflammation. This may include medicine that is injected into your wrist. A wrist splint or brace. Surgery. Follow these instructions at home: If you have a splint or brace: Wear the splint or brace as told by your health care provider. Remove it only as told by your health care provider. Loosen the splint or brace if your fingers tingle, become numb, or turn cold and blue. Keep the splint or brace clean. If the splint or brace is not waterproof: Do not let it get wet. Cover it with a watertight covering when you take a bath or shower. Managing pain, stiffness, and swelling If directed, put ice on the painful area. To do this: If you have a removeable splint or brace, remove it as told by your health care provider. Put ice in a plastic bag. Place a towel between your skin and the bag or between the splint or brace and the bag. Leave the ice on for 20 minutes, 2-3  times a day. Do not fall asleep with the cold pack on your skin. Remove the ice if your skin turns bright red. This is very important. If you cannot feel pain, heat, or cold, you have a greater risk of damage to the area. Move your fingers often to reduce stiffness and swelling. General instructions Take over-the-counter and prescription medicines only as told by your health care provider. Rest your wrist and hand from any activity that may be causing your pain. If your condition is work related, talk with your employer about changes that can be made, such as getting a wrist pad to use while typing. Do any exercises as told by your health care provider, physical therapist, or occupational therapist. Keep all follow-up visits. This is important. Contact a health care provider if: You have new symptoms. Your pain is not controlled with medicines. Your symptoms get worse. Get help right away if: You have severe numbness or tingling in your wrist  or hand. Summary Carpal tunnel syndrome is a condition that causes pain, numbness, and weakness in your hand and fingers. It is usually caused by repeated wrist motions. Lifestyle changes and medicines are used to treat carpal tunnel syndrome. Surgery may be recommended. Follow your health care provider's instructions about wearing a splint, resting from activity, keeping follow-up visits, and calling for help. This information is not intended to replace advice given to you by your health care provider. Make sure you discuss any questions you have with your health care provider. Document Revised: 02/13/2020 Document Reviewed: 02/13/2020 Elsevier Patient Education  2022 Elsevier Inc.   Electromyoneurogram Electromyoneurogram is a test to check how well your muscles and nerves are working. This procedure includes the combined use of electromyogram (EMG) and nerve conduction study (NCS). EMG is used to look for muscular disorders. NCS, which is also  called electroneurogram, measures how well your nerves are controlling your muscles. The procedures are usually done together to check if your muscles and nerves are healthy. If the results of the tests are abnormal, this may indicate disease or injury, such as a neuromuscular disease or peripheral nerve damage. Tell a health care provider about: Any allergies you have. All medicines you are taking, including vitamins, herbs, eye drops, creams, and over-the-counter medicines. Any problems you or family members have had with anesthetic medicines. Any blood disorders you have. Any surgeries you have had. Any medical conditions you have. If you have a pacemaker. Whether you are pregnant or may be pregnant. What are the risks? Generally, this is a safe procedure. However, problems may occur, including: Infection where the electrodes were inserted. Bleeding. What happens before the procedure? Medicines Ask your health care provider about: Changing or stopping your regular medicines. This is especially important if you are taking diabetes medicines or blood thinners. Taking medicines such as aspirin and ibuprofen. These medicines can thin your blood. Do not take these medicines unless your health care provider tells you to take them. Taking over-the-counter medicines, vitamins, herbs, and supplements. General instructions Your health care provider may ask you to avoid: Beverages that have caffeine, such as coffee and tea. Any products that contain nicotine or tobacco. These products include cigarettes, e-cigarettes, and chewing tobacco. If you need help quitting, ask your health care provider. Do not use lotions or creams on the same day that you will be having the procedure. What happens during the procedure? For EMG  Your health care provider will ask you to stay in a position so that he or she can access the muscle that will be studied. You may be standing, sitting, or lying down. You may  be given a medicine that numbs the area (local anesthetic). A very thin needle that has an electrode will be inserted into your muscle. Another small electrode will be placed on your skin near the muscle. Your health care provider will ask you to continue to remain still. The electrodes will send a signal that tells about the electrical activity of your muscles. You may see this on a monitor or hear it in the room. After your muscles have been studied at rest, your health care provider will ask you to contract or flex your muscles. The electrodes will send a signal that tells about the electrical activity of your muscles. Your health care provider will remove the electrodes and the electrode needles when the procedure is finished. The procedure may vary among health care providers and hospitals. For NCS  An electrode that  records your nerve activity (recording electrode) will be placed on your skin by the muscle that is being studied. An electrode that is used as a reference (reference electrode) will be placed near the recording electrode. A paste or gel will be applied to your skin between the recording electrode and the reference electrode. Your nerve will be stimulated with a mild shock. Your health care provider will measure how much time it takes for your muscle to react. Your health care provider will remove the electrodes and the gel when the procedure is finished. The procedure may vary among health care providers and hospitals. What happens after the procedure? It is up to you to get the results of your procedure. Ask your health care provider, or the department that is doing the procedure, when your results will be ready. Your health care provider may: Give you medicines for any pain. Monitor the insertion sites to make sure that bleeding stops. Summary Electromyoneurogram is a test to check how well your muscles and nerves are working. If the results of the tests are abnormal, this  may indicate disease or injury. This is a safe procedure. However, problems may occur, such as bleeding and infection. Your health care provider will do two tests to complete this procedure. One checks your muscles (EMG) and another checks your nerves (NCS). It is up to you to get the results of your procedure. Ask your health care provider, or the department that is doing the procedure, when your results will be ready. This information is not intended to replace advice given to you by your health care provider. Make sure you discuss any questions you have with your health care provider. Document Revised: 06/19/2018 Document Reviewed: 06/01/2018 Elsevier Patient Education  2022 [employer].

## 2021-08-20 ENCOUNTER — Ambulatory Visit
Admission: RE | Admit: 2021-08-20 | Discharge: 2021-08-20 | Disposition: A | Discharge status: Home / Self Care | Payer: 59 | Source: Ambulatory Visit | Attending: Neurology | Admitting: Neurology

## 2021-08-20 DIAGNOSIS — M79644 Pain in right finger(s): Secondary | ICD-10-CM

## 2021-08-20 DIAGNOSIS — M79641 Pain in right hand: Secondary | ICD-10-CM

## 2021-08-23 ENCOUNTER — Ambulatory Visit (INDEPENDENT_AMBULATORY_CARE_PROVIDER_SITE_OTHER): Payer: 59 | Admitting: Neurology

## 2021-08-23 ENCOUNTER — Ambulatory Visit: Payer: 59 | Admitting: Neurology

## 2021-08-23 DIAGNOSIS — G5603 Carpal tunnel syndrome, bilateral upper limbs: Secondary | ICD-10-CM

## 2021-08-23 DIAGNOSIS — M79644 Pain in right finger(s): Secondary | ICD-10-CM

## 2021-08-23 DIAGNOSIS — Z0289 Encounter for other administrative examinations: Secondary | ICD-10-CM

## 2021-08-23 DIAGNOSIS — M79641 Pain in right hand: Secondary | ICD-10-CM

## 2021-08-24 NOTE — Progress Notes (Signed)
See procedure note.

## 2021-08-25 ENCOUNTER — Other Ambulatory Visit: Payer: Self-pay | Admitting: Internal Medicine

## 2021-08-25 ENCOUNTER — Encounter: Payer: Self-pay | Admitting: Nurse Practitioner

## 2021-08-25 DIAGNOSIS — Z1231 Encounter for screening mammogram for malignant neoplasm of breast: Secondary | ICD-10-CM

## 2021-08-26 ENCOUNTER — Ambulatory Visit
Admission: RE | Admit: 2021-08-26 | Discharge: 2021-08-26 | Disposition: A | Discharge status: Home / Self Care | Payer: 59 | Source: Ambulatory Visit | Attending: Internal Medicine | Admitting: Internal Medicine

## 2021-08-26 DIAGNOSIS — Z1231 Encounter for screening mammogram for malignant neoplasm of breast: Secondary | ICD-10-CM

## 2021-08-26 NOTE — Addendum Note (Signed)
Addended by: Naomie Dean B on: 08/26/2021 12:46 PM   Modules accepted: Orders

## 2021-08-26 NOTE — Progress Notes (Addendum)
Full Name: Kelly Charles Gender: Female MRN #: [id number] Date of Birth: 02/23/1976    Visit Date: 08/23/2021 15:23 Age: 45 Years Examining Physician: Naomie Dean, MD  Requesting Provider: [name], MD Primary Care Provider:  [name], MD Height: 5 feet 5 inch 223 lbs  Patient History: Kelly Charles is a 45 y.o. female here as requested by [name], MD for possible Carpal Tunnel Syndrome/right wrist pain.  Summary: Nerve Conduction Studies were performed on the bilateral upper extremities.  The right median APB motor nerve showed prolonged distal onset latency (4.7 ms, N<4.4). The left  median APB motor nerve showed prolonged distal onset latency (4.9 ms, N<4.4). The right Median 2nd Digit orthodromic sensory nerve showed prolonged distal peak latency 4.9 ms, N<3.4) and reduced amplitude(8 uV, N>10). The left  Median 2nd Digit orthodromic sensory nerve showed prolonged distal peak latency 4.0 ms, N<3.4) and reduced amplitude(8 uV, N>10).The right median/ulnar (palm) comparison nerve showed prolonged distal peak latency (Median Palm, 2.9 ms, N<2.2) and abnormal peak latency difference (Median Palm-Ulnar Palm, 1.0 ms, N<0.4) with a relative median delay.  The left median/ulnar (palm) comparison nerve showed prolonged distal peak latency (Median Palm,3.0 ms, N<2.2) and abnormal peak latency difference (Median Palm-Ulnar Palm, 0.9 ms, N<0.4) with a relative median delay.  All remaining nerves (as indicated in the following tables) were within normal limits.  All muscles (as indicated in the following tables) were within normal limits.     Conclusion: There is electrophysiologic evidence of bilateral moderately-severe Carpal Tunnel Syndrome clinically worse on the right.  No suggestion of polyneuropathy or radiculopathy.   ------------------------------- Naomie Dean, M.D.  Orders Placed This Encounter  Procedures   Ambulatory referral to Hand  Surgery    [facility] [address] [place], [place] [address] Tel: [phone] Fax: [phone]  Verbal informed consent was obtained from the patient, patient was informed of potential risk of procedure, including bruising, bleeding, hematoma formation, infection, muscle weakness, muscle pain, numbness, among others.        MNC    Nerve / Sites Muscle Latency Ref. Amplitude Ref. Rel Amp Segments Distance Velocity Ref. Area    ms ms mV mV %  cm m/s m/s mVms  L Median - APB     Wrist APB 4.9 ?4.4 8.3 ?4.0 100 Wrist - APB 7   37.2     Upper arm APB 8.9  8.0  97.1 Upper arm - Wrist 22 54 ?49 36.0  R Median - APB     Wrist APB 4.7 ?4.4 10.7 ?4.0 100 Wrist - APB 7   41.6     Upper arm APB 9.1  10.1  94.4 Upper arm - Wrist 23 53 ?49 39.3  R Ulnar - ADM     Wrist ADM 2.7 ?3.3 9.1 ?6.0 100 Wrist - ADM 7   39.8     B.Elbow ADM 6.0  9.3  102 B.Elbow - Wrist 19 58 ?49 39.0     A.Elbow ADM 7.7  9.5  101 A.Elbow - B.Elbow 10 58 ?49 39.2           SNC    Nerve / Sites Rec. Site Peak Lat Ref.  Amp Ref. Segments Distance Peak Diff Ref.    ms ms V V  cm ms ms  L Median, Ulnar - Transcarpal comparison     Median Palm Wrist 3.0 ?2.2 35 ?35 Median Palm - Wrist 8  Ulnar Palm Wrist 2.1 ?2.2 15 ?12 Ulnar Palm - Wrist 8          Median Palm - Ulnar Palm  0.9 ?0.4  R Median, Ulnar - Transcarpal comparison     Median Palm Wrist 2.9 ?2.2 36 ?35 Median Palm - Wrist 8       Ulnar Palm Wrist 1.9 ?2.2 18 ?12 Ulnar Palm - Wrist 8          Median Palm - Ulnar Palm  1.0 ?0.4  L Median - Orthodromic (Dig II, Mid palm)     Dig II Wrist 4.0 ?3.4 8 ?10 Dig II - Wrist 13    R Median - Orthodromic (Dig II, Mid palm)     Dig II Wrist 3.9 ?3.4 8 ?10 Dig II - Wrist 13    R Ulnar - Orthodromic, (Dig V, Mid palm)     Dig V Wrist 2.7 ?3.1 10 ?5 Dig V - Wrist 47                 F  Wave    Nerve F Lat Ref.   ms ms  R Ulnar - ADM 27.2 ?32.0       EMG Summary Table     Spontaneous MUAP Recruitment  Muscle IA Fib PSW Fasc Other Amp Dur. Poly Pattern  R. Deltoid Normal None None None _______ Normal Normal Normal Normal  L. Deltoid Normal None None None _______ Normal Normal Normal Normal  L. Triceps brachii Normal None None None _______ Normal Normal Normal Normal  R. Triceps brachii Normal None None None _______ Normal Normal Normal Normal  L. Pronator teres Normal None None None _______ Normal Normal Normal Normal  R. Pronator teres Normal None None None _______ Normal Normal Normal Normal  L. First dorsal interosseous Normal None None None _______ Normal Normal Normal Normal  R. First dorsal interosseous Normal None None None _______ Normal Normal Normal Normal  L. Cervical paraspinals (low) Normal None None None _______ Normal Normal Normal Normal  R. Cervical paraspinals (low) Normal None None None _______ Normal Normal Normal Normal  L. Opponens pollicis Normal None None None _______ Normal Normal Normal Normal  R. Opponens pollicis Normal None None None _______ Normal Normal Normal Normal

## 2021-08-26 NOTE — Procedures (Signed)
Full Name: Kelly Charles Gender: Female MRN #: [id number] Date of Birth: 02/23/1976    Visit Date: 08/23/2021 15:23 Age: 45 Years Examining Physician: Naomie Dean, MD  Requesting Provider: [name], MD Primary Care Provider:  [name], MD Height: 5 feet 5 inch 223 lbs  Patient History: Kelly Charles is a 45 y.o. female here as requested by [name], MD for possible Carpal Tunnel Syndrome/right wrist pain.  Summary: Nerve Conduction Studies were performed on the bilateral upper extremities.  The right median APB motor nerve showed prolonged distal onset latency (4.7 ms, N<4.4). The left  median APB motor nerve showed prolonged distal onset latency (4.9 ms, N<4.4). The right Median 2nd Digit orthodromic sensory nerve showed prolonged distal peak latency 4.9 ms, N<3.4) and reduced amplitude(8 uV, N>10). The left  Median 2nd Digit orthodromic sensory nerve showed prolonged distal peak latency 4.0 ms, N<3.4) and reduced amplitude(8 uV, N>10).The right median/ulnar (palm) comparison nerve showed prolonged distal peak latency (Median Palm, 2.9 ms, N<2.2) and abnormal peak latency difference (Median Palm-Ulnar Palm, 1.0 ms, N<0.4) with a relative median delay.  The left median/ulnar (palm) comparison nerve showed prolonged distal peak latency (Median Palm,3.0 ms, N<2.2) and abnormal peak latency difference (Median Palm-Ulnar Palm, 0.9 ms, N<0.4) with a relative median delay.  All remaining nerves (as indicated in the following tables) were within normal limits.  All muscles (as indicated in the following tables) were within normal limits.     Conclusion: There is electrophysiologic evidence of bilateral moderately-severe Carpal Tunnel Syndrome clinically worse on the right.  No suggestion of polyneuropathy or radiculopathy.   ------------------------------- Naomie Dean, M.D.  Orders Placed This Encounter  Procedures   Ambulatory referral to Hand  Surgery    [facility] [address] [place], [place] [address] Tel: [phone] Fax: [phone]  Verbal informed consent was obtained from the patient, patient was informed of potential risk of procedure, including bruising, bleeding, hematoma formation, infection, muscle weakness, muscle pain, numbness, among others.        MNC    Nerve / Sites Muscle Latency Ref. Amplitude Ref. Rel Amp Segments Distance Velocity Ref. Area    ms ms mV mV %  cm m/s m/s mVms  L Median - APB     Wrist APB 4.9 ?4.4 8.3 ?4.0 100 Wrist - APB 7   37.2     Upper arm APB 8.9  8.0  97.1 Upper arm - Wrist 22 54 ?49 36.0  R Median - APB     Wrist APB 4.7 ?4.4 10.7 ?4.0 100 Wrist - APB 7   41.6     Upper arm APB 9.1  10.1  94.4 Upper arm - Wrist 23 53 ?49 39.3  R Ulnar - ADM     Wrist ADM 2.7 ?3.3 9.1 ?6.0 100 Wrist - ADM 7   39.8     B.Elbow ADM 6.0  9.3  102 B.Elbow - Wrist 19 58 ?49 39.0     A.Elbow ADM 7.7  9.5  101 A.Elbow - B.Elbow 10 58 ?49 39.2           SNC    Nerve / Sites Rec. Site Peak Lat Ref.  Amp Ref. Segments Distance Peak Diff Ref.    ms ms V V  cm ms ms  L Median, Ulnar - Transcarpal comparison     Median Palm Wrist 3.0 ?2.2 35 ?35 Median Palm - Wrist 8  Ulnar Palm Wrist 2.1 ?2.2 15 ?12 Ulnar Palm - Wrist 8          Median Palm - Ulnar Palm  0.9 ?0.4  R Median, Ulnar - Transcarpal comparison     Median Palm Wrist 2.9 ?2.2 36 ?35 Median Palm - Wrist 8       Ulnar Palm Wrist 1.9 ?2.2 18 ?12 Ulnar Palm - Wrist 8          Median Palm - Ulnar Palm  1.0 ?0.4  L Median - Orthodromic (Dig II, Mid palm)     Dig II Wrist 4.0 ?3.4 8 ?10 Dig II - Wrist 13    R Median - Orthodromic (Dig II, Mid palm)     Dig II Wrist 3.9 ?3.4 8 ?10 Dig II - Wrist 13    R Ulnar - Orthodromic, (Dig V, Mid palm)     Dig V Wrist 2.7 ?3.1 10 ?5 Dig V - Wrist 58                 F  Wave    Nerve F Lat Ref.   ms ms  R Ulnar - ADM 27.2 ?32.0       EMG Summary Table     Spontaneous MUAP Recruitment  Muscle IA Fib PSW Fasc Other Amp Dur. Poly Pattern  R. Deltoid Normal None None None _______ Normal Normal Normal Normal  L. Deltoid Normal None None None _______ Normal Normal Normal Normal  L. Triceps brachii Normal None None None _______ Normal Normal Normal Normal  R. Triceps brachii Normal None None None _______ Normal Normal Normal Normal  L. Pronator teres Normal None None None _______ Normal Normal Normal Normal  R. Pronator teres Normal None None None _______ Normal Normal Normal Normal  L. First dorsal interosseous Normal None None None _______ Normal Normal Normal Normal  R. First dorsal interosseous Normal None None None _______ Normal Normal Normal Normal  L. Cervical paraspinals (low) Normal None None None _______ Normal Normal Normal Normal  R. Cervical paraspinals (low) Normal None None None _______ Normal Normal Normal Normal  L. Opponens pollicis Normal None None None _______ Normal Normal Normal Normal  R. Opponens pollicis Normal None None None _______ Normal Normal Normal Normal

## 2021-08-30 ENCOUNTER — Encounter: Payer: Self-pay | Admitting: Internal Medicine

## 2021-09-01 ENCOUNTER — Telehealth: Payer: Self-pay | Admitting: Neurology

## 2021-09-01 NOTE — Telephone Encounter (Signed)
Referral sent to [facility]. Phone: [phone].

## 2021-09-01 NOTE — Telephone Encounter (Signed)
Referral was placed for hand surgery to see Dr. Merlyn Lot. His practice does not take patient's insurance [facility]). Is it ok to send to [facility]?

## 2021-09-03 ENCOUNTER — Other Ambulatory Visit (HOSPITAL_BASED_OUTPATIENT_CLINIC_OR_DEPARTMENT_OTHER): Payer: Self-pay

## 2021-09-03 MED ORDER — PFIZER COVID-19 VAC BIVALENT 30 MCG/0.3ML IM SUSP
INTRAMUSCULAR | 0 refills | Status: DC
  Filled 2021-09-03: qty 0.3, 1d supply, fill #0

## 2021-09-14 ENCOUNTER — Other Ambulatory Visit: Payer: Self-pay | Admitting: Internal Medicine

## 2021-09-14 DIAGNOSIS — F5101 Primary insomnia: Secondary | ICD-10-CM

## 2021-09-16 ENCOUNTER — Encounter: Payer: Self-pay | Admitting: Internal Medicine

## 2021-09-16 NOTE — H&P (Signed)
Preoperative History & Physical Exam  Surgeon: Matt Holmes, MD  Diagnosis: Right Carpal Tunnel Syndrome  Planned Procedure: Procedure(s) (LRB): Right CARPAL TUNNEL RELEASE (Right)  History of Present Illness:   Patient is a 45 y.o. female with symptoms consistent with  Right Carpal Tunnel Syndrome who presents for surgical intervention. The risks, benefits and alternatives of surgical intervention were discussed and informed consent was obtained prior to surgery.  Past Medical History:  Past Medical History:  Diagnosis Date   Diabetes mellitus without complication ([place])    Trigeminal neuralgia    Trigeminal neuralgia    Trigeminal neuralgia     Past Surgical History:  Past Surgical History:  Procedure Laterality Date   CEREBRAL MICROVASCULAR DECOMPRESSION Right 10/01/15   [facility]  Dr. Arlan Organ   TUBAL LIGATION     WISDOM TOOTH EXTRACTION      Medications:  Prior to Admission medications   Medication Sig Start Date End Date Taking? Authorizing Provider  busPIRone (BUSPAR) 10 MG tablet Take 1 tablet (10 mg total) by mouth 2 (two) times daily. Patient taking differently: Take 5 mg by mouth 2 (two) times daily. 06/07/21   Glendale Chard, MD  COVID-19 mRNA bivalent vaccine, Pfizer, (PFIZER COVID-19 [facility] BIVALENT) injection Inject into the muscle. 08/09/21   Carlyle Basques, MD  DULoxetine (CYMBALTA) 60 MG capsule TAKE 1 CAPSULE BY MOUTH EVERY DAY 06/03/21   [name], FNP  Eszopiclone 3 MG TABS TAKE 1 TABLET BY MOUTH EVERY DAY AT BEDTIME AS NEEDED FOR SLEEP 09/14/21   Glendale Chard, MD  metFORMIN (GLUCOPHAGE) 1000 MG tablet TAKE 1 TABLET BY MOUTH EVERY DAY WITH BREAKFAST 08/20/21   Glendale Chard, MD  Vitamin D, Ergocalciferol, (DRISDOL) 1.25 MG (50000 UNIT) CAPS capsule Take 1 capsule (50,000 Units total) by mouth every 7 (seven) days. 12/03/20   Glendale Chard, MD    Allergies:  Levaquin [levofloxacin] and Penicillins  Review of Systems: Negative except per HPI.  Physical  Exam: Alert and oriented, NAD Head and neck: no masses, normal alignment CV: pulse intact Pulm: no increased work of breathing, respirations even and unlabored Abdomen: non-distended Extremities: extremities warm and well perfused  LABS: Recent Results (from the past 2160 hour(s))  TSH + free T4     Status: Abnormal   Collection Time: 07/19/21  3:06 PM  Result Value Ref Range   TSH 0.332 (L) 0.450 - 4.500 uIU/mL   Free T4 1.25 0.82 - 1.77 ng/dL  Magnesium     Status: None   Collection Time: 07/19/21  3:06 PM  Result Value Ref Range   Magnesium 1.9 1.6 - 2.3 mg/dL  CBC no Diff     Status: Abnormal   Collection Time: 07/19/21  3:06 PM  Result Value Ref Range   WBC 6.9 3.4 - 10.8 x10E3/uL   RBC 4.87 3.77 - 5.28 x10E6/uL   Hemoglobin 10.3 (L) 11.1 - 15.9 g/dL   Hematocrit 33.8 (L) 34.0 - 46.6 %   MCV 69 (L) 79 - 97 fL   MCH 21.1 (L) 26.6 - 33.0 pg   MCHC 30.5 (L) 31.5 - 35.7 g/dL   RDW 14.8 11.7 - 15.4 %   Platelets 420 150 - 450 x10E3/uL  Iron     Status: None   Collection Time: 07/19/21  3:06 PM  Result Value Ref Range   Iron 36 27 - 159 ug/dL  T3, free     Status: None   Collection Time: 07/19/21  3:06 PM  Result Value Ref Range  T3, Free 2.7 2.0 - 4.4 pg/mL  Specimen status report     Status: None   Collection Time: 07/19/21  3:06 PM  Result Value Ref Range   specimen status report Comment     Comment: Written Authorization Written Authorization Written Authorization Received. Authorization received from robyn sanders 07-21-2021 Logged by Margaretann Loveless   Basic metabolic panel     Status: None   Collection Time: 08/11/21  1:27 PM  Result Value Ref Range   Sodium 136 135 - 145 mmol/L   Potassium 3.9 3.5 - 5.1 mmol/L   Chloride 106 98 - 111 mmol/L   CO2 22 22 - 32 mmol/L   Glucose, Bld 83 70 - 99 mg/dL    Comment: Glucose reference range applies only to samples taken after fasting for at least 8 hours.   BUN 8 6 - 20 mg/dL   Creatinine, Ser 4.69 0.44 - 1.00  mg/dL   Calcium 9.2 8.9 - 62.9 mg/dL   GFR, Estimated >52 >84 mL/min    Comment: (NOTE) Calculated using the CKD-EPI Creatinine Equation (2021)    Anion gap 8 5 - 15    Comment: Performed at [facility], [address] [place], [place] [address]  CBC     Status: Abnormal   Collection Time: 08/11/21  1:27 PM  Result Value Ref Range   WBC 6.0 4.0 - 10.5 K/uL   RBC 5.18 (H) 3.87 - 5.11 MIL/uL   Hemoglobin 11.2 (L) 12.0 - 15.0 g/dL   HCT 01.0 27.2 - 53.6 %   MCV 70.7 (L) 80.0 - 100.0 fL   MCH 21.6 (L) 26.0 - 34.0 pg   MCHC 30.6 30.0 - 36.0 g/dL   RDW 64.4 (H) 03.4 - 74.2 %   Platelets 476 (H) 150 - 400 K/uL   nRBC 0.0 0.0 - 0.2 %    Comment: Performed at [facility], [address] [place], [place] [address]  Troponin I (High Sensitivity)     Status: None   Collection Time: 08/11/21  1:27 PM  Result Value Ref Range   Troponin I (High Sensitivity) <2 <18 ng/L    Comment: (NOTE) Elevated high sensitivity troponin I (hsTnI) values and significant  changes across serial measurements may suggest ACS but many other  chronic and acute conditions are known to elevate hsTnI results.  Refer to the "Links" section for chest pain algorithms and additional  guidance. Performed at [facility], [address], [place], [place] [address]   I-Stat beta hCG blood, ED     Status: None   Collection Time: 08/11/21  1:53 PM  Result Value Ref Range   I-stat hCG, quantitative <5.0 <5 mIU/mL   Comment 3            Comment:   GEST. AGE      CONC.  (mIU/mL)   <=1 WEEK        5 - 50     2 WEEKS       50 - 500     3 WEEKS       100 - 10,000     4 WEEKS     1,000 - 30,000        FEMALE AND NON-PREGNANT FEMALE:     LESS THAN 5 mIU/mL   Troponin I (High Sensitivity)     Status: None   Collection Time: 08/11/21  3:35 PM  Result Value Ref Range   Troponin I (  High Sensitivity) <2 <18 ng/L    Comment: (NOTE) Elevated high sensitivity troponin I (hsTnI) values and significant   changes across serial measurements may suggest ACS but many other  chronic and acute conditions are known to elevate hsTnI results.  Refer to the "Links" section for chest pain algorithms and additional  guidance. Performed at [facility], [address], [place], [place] [address]      Complete History and Physical exam available in the office notes  Orene Desanctis

## 2021-09-21 ENCOUNTER — Other Ambulatory Visit: Payer: Self-pay

## 2021-09-21 ENCOUNTER — Encounter (HOSPITAL_BASED_OUTPATIENT_CLINIC_OR_DEPARTMENT_OTHER): Payer: Self-pay | Admitting: Orthopedic Surgery

## 2021-09-21 NOTE — Progress Notes (Signed)
Spoke w/ via phone for pre-op interview---pt Lab needs dos----  ISTAT, urine pregnancy POCT             Lab results------08/11/21 Cxray in Epic, 08/11/21 EKG in chart & Epic COVID test -----patient states asymptomatic no test needed Arrive at -------1100 on 09/23/21 NPO after MN NO Solid Food.  Clear liquids from MN until---1000 Med rec completed Medications to take morning of surgery -----Busbar, Cymbalta Diabetic medication -----Hold Metformin morning of surgery Patient instructed no nail polish to be worn day of surgery Patient instructed to bring photo id and insurance card day of surgery Patient aware to have Driver (ride ) / caregiver    for 24 hours after surgery  - husband Channing Mutters Patient Special Instructions -----none Pre-Op special Istructions -----none Patient verbalized understanding of instructions that were given at this phone interview. Patient denies shortness of breath, chest pain, fever, cough at this phone interview.

## 2021-09-23 ENCOUNTER — Encounter (HOSPITAL_BASED_OUTPATIENT_CLINIC_OR_DEPARTMENT_OTHER)
Admission: RE | Disposition: A | Discharge status: Home / Self Care | Payer: Self-pay | Source: Home / Self Care | Attending: Orthopedic Surgery

## 2021-09-23 ENCOUNTER — Encounter (HOSPITAL_BASED_OUTPATIENT_CLINIC_OR_DEPARTMENT_OTHER): Payer: Self-pay | Admitting: Orthopedic Surgery

## 2021-09-23 ENCOUNTER — Ambulatory Visit (HOSPITAL_BASED_OUTPATIENT_CLINIC_OR_DEPARTMENT_OTHER): Payer: 59 | Admitting: Anesthesiology

## 2021-09-23 ENCOUNTER — Ambulatory Visit: Payer: 59 | Admitting: Neurology

## 2021-09-23 ENCOUNTER — Ambulatory Visit (HOSPITAL_BASED_OUTPATIENT_CLINIC_OR_DEPARTMENT_OTHER)
Admission: RE | Admit: 2021-09-23 | Discharge: 2021-09-23 | Disposition: A | Discharge status: Home / Self Care | Payer: 59 | Attending: Orthopedic Surgery | Admitting: Orthopedic Surgery

## 2021-09-23 DIAGNOSIS — Z7984 Long term (current) use of oral hypoglycemic drugs: Secondary | ICD-10-CM | POA: Insufficient documentation

## 2021-09-23 DIAGNOSIS — E119 Type 2 diabetes mellitus without complications: Secondary | ICD-10-CM | POA: Diagnosis not present

## 2021-09-23 DIAGNOSIS — G5601 Carpal tunnel syndrome, right upper limb: Secondary | ICD-10-CM | POA: Diagnosis not present

## 2021-09-23 HISTORY — DX: Anxiety disorder, unspecified: F41.9

## 2021-09-23 HISTORY — PX: CARPAL TUNNEL RELEASE: SHX101

## 2021-09-23 HISTORY — DX: Carpal tunnel syndrome, bilateral upper limbs: G56.03

## 2021-09-23 HISTORY — DX: Depression, unspecified: F32.A

## 2021-09-23 LAB — POCT I-STAT, CHEM 8
BUN: 11 mg/dL (ref 6–20)
Calcium, Ion: 1.26 mmol/L (ref 1.15–1.40)
Chloride: 103 mmol/L (ref 98–111)
Creatinine, Ser: 0.6 mg/dL (ref 0.44–1.00)
Glucose, Bld: 103 mg/dL — ABNORMAL HIGH (ref 70–99)
HCT: 38 % (ref 36.0–46.0)
Hemoglobin: 12.9 g/dL (ref 12.0–15.0)
Potassium: 4.5 mmol/L (ref 3.5–5.1)
Sodium: 138 mmol/L (ref 135–145)
TCO2: 25 mmol/L (ref 22–32)

## 2021-09-23 LAB — GLUCOSE, CAPILLARY: Glucose-Capillary: 106 mg/dL — ABNORMAL HIGH (ref 70–99)

## 2021-09-23 LAB — POCT PREGNANCY, URINE: Preg Test, Ur: NEGATIVE

## 2021-09-23 SURGERY — CARPAL TUNNEL RELEASE
Anesthesia: Monitor Anesthesia Care | Site: Wrist | Laterality: Right

## 2021-09-23 MED ORDER — LIDOCAINE HCL (CARDIAC) PF 100 MG/5ML IV SOSY
PREFILLED_SYRINGE | INTRAVENOUS | Status: DC | PRN
  Administered 2021-09-23: 40 mg via INTRAVENOUS

## 2021-09-23 MED ORDER — CELECOXIB 200 MG PO CAPS
ORAL_CAPSULE | ORAL | Status: AC
  Filled 2021-09-23: qty 1

## 2021-09-23 MED ORDER — CELECOXIB 200 MG PO CAPS
200.0000 mg | ORAL_CAPSULE | Freq: Once | ORAL | Status: AC
  Administered 2021-09-23: 200 mg via ORAL

## 2021-09-23 MED ORDER — LIDOCAINE HCL (PF) 1 % IJ SOLN
INTRAMUSCULAR | Status: DC | PRN
  Administered 2021-09-23: 5 mL

## 2021-09-23 MED ORDER — ACETAMINOPHEN 500 MG PO TABS
ORAL_TABLET | ORAL | Status: AC
  Filled 2021-09-23: qty 2

## 2021-09-23 MED ORDER — ACETAMINOPHEN 500 MG PO TABS
1000.0000 mg | ORAL_TABLET | Freq: Once | ORAL | Status: AC
  Administered 2021-09-23: 1000 mg via ORAL

## 2021-09-23 MED ORDER — ONDANSETRON HCL 4 MG/2ML IJ SOLN
INTRAMUSCULAR | Status: DC | PRN
  Administered 2021-09-23: 4 mg via INTRAVENOUS

## 2021-09-23 MED ORDER — PROPOFOL 500 MG/50ML IV EMUL
INTRAVENOUS | Status: DC | PRN
  Administered 2021-09-23: 50 ug/kg/min via INTRAVENOUS

## 2021-09-23 MED ORDER — HYDROCODONE-ACETAMINOPHEN 5-325 MG PO TABS: 1.0000 | ORAL_TABLET | Freq: Four times a day (QID) | ORAL | 0 refills | Status: AC | PRN

## 2021-09-23 MED ORDER — PROPOFOL 10 MG/ML IV BOLUS
INTRAVENOUS | Status: DC | PRN
  Administered 2021-09-23: 50 mg via INTRAVENOUS
  Administered 2021-09-23: 40 mg via INTRAVENOUS
  Administered 2021-09-23: 50 mg via INTRAVENOUS

## 2021-09-23 MED ORDER — CEFAZOLIN SODIUM-DEXTROSE 2-4 GM/100ML-% IV SOLN
2.0000 g | INTRAVENOUS | Status: AC
  Administered 2021-09-23: 2 g via INTRAVENOUS

## 2021-09-23 MED ORDER — BUPIVACAINE HCL (PF) 0.5 % IJ SOLN
INTRAMUSCULAR | Status: DC | PRN
  Administered 2021-09-23: 5 mL

## 2021-09-23 MED ORDER — FENTANYL CITRATE (PF) 100 MCG/2ML IJ SOLN: 25.0000 ug | INTRAMUSCULAR | Status: DC | PRN

## 2021-09-23 MED ORDER — CEFAZOLIN SODIUM-DEXTROSE 2-4 GM/100ML-% IV SOLN
INTRAVENOUS | Status: AC
  Filled 2021-09-23: qty 100

## 2021-09-23 MED ORDER — LACTATED RINGERS IV SOLN: INTRAVENOUS | Status: DC

## 2021-09-23 MED ORDER — 0.9 % SODIUM CHLORIDE (POUR BTL) OPTIME
TOPICAL | Status: DC | PRN
  Administered 2021-09-23: 500 mL

## 2021-09-23 MED ORDER — MIDAZOLAM HCL 2 MG/2ML IJ SOLN
INTRAMUSCULAR | Status: AC
  Filled 2021-09-23: qty 2

## 2021-09-23 SURGICAL SUPPLY — 29 items
BLADE SURG 15 STRL LF DISP TIS (BLADE) ×1 IMPLANT
BLADE SURG 15 STRL SS (BLADE) ×2
BNDG CMPR 9X4 STRL LF SNTH (GAUZE/BANDAGES/DRESSINGS) ×1
BNDG ELASTIC 4X5.8 VLCR STR LF (GAUZE/BANDAGES/DRESSINGS) ×2 IMPLANT
BNDG ESMARK 4X9 LF (GAUZE/BANDAGES/DRESSINGS) ×2 IMPLANT
COVER BACK TABLE 60X90IN (DRAPES) ×2 IMPLANT
CUFF TOURN SGL QUICK 24 (TOURNIQUET CUFF) ×2
CUFF TRNQT CYL 24X4X16.5-23 (TOURNIQUET CUFF) ×1 IMPLANT
DRAPE EXTREMITY T 121X128X90 (DISPOSABLE) ×2 IMPLANT
GAUZE 4X4 16PLY ~~LOC~~+RFID DBL (SPONGE) ×2 IMPLANT
GAUZE SPONGE 4X4 12PLY STRL (GAUZE/BANDAGES/DRESSINGS) ×2 IMPLANT
GAUZE XEROFORM 1X8 LF (GAUZE/BANDAGES/DRESSINGS) ×2 IMPLANT
GLOVE SURG ENC MOIS LTX SZ7.5 (GLOVE) ×2 IMPLANT
GOWN STRL REUS W/ TWL LRG LVL3 (GOWN DISPOSABLE) ×1 IMPLANT
GOWN STRL REUS W/TWL LRG LVL3 (GOWN DISPOSABLE) ×2
HIBICLENS CHG 4% 4OZ BTL (MISCELLANEOUS) ×2 IMPLANT
KIT TURNOVER CYSTO (KITS) ×2 IMPLANT
KNIFE CARPAL TUNNEL (BLADE) ×2 IMPLANT
NEEDLE HYPO 25X1 1.5 SAFETY (NEEDLE) ×2 IMPLANT
NS IRRIG 500ML POUR BTL (IV SOLUTION) ×2 IMPLANT
PACK BASIN DAY SURGERY FS (CUSTOM PROCEDURE TRAY) ×2 IMPLANT
PAD CAST 4YDX4 CTTN HI CHSV (CAST SUPPLIES) ×1 IMPLANT
PADDING CAST COTTON 4X4 STRL (CAST SUPPLIES) ×2
SUT ETHILON 4 0 PS 2 18 (SUTURE) ×2 IMPLANT
SYR 10ML LL (SYRINGE) ×2 IMPLANT
SYR BULB EAR ULCER 3OZ GRN STR (SYRINGE) ×2 IMPLANT
TOWEL OR 17X26 10 PK STRL BLUE (TOWEL DISPOSABLE) ×2 IMPLANT
TRAY DSU PREP LF (CUSTOM PROCEDURE TRAY) ×2 IMPLANT
UNDERPAD 30X36 HEAVY ABSORB (UNDERPADS AND DIAPERS) ×2 IMPLANT

## 2021-09-23 NOTE — Interval H&P Note (Signed)
History and Physical Interval Note:  09/23/2021 1:13 PM  Kelly Charles  has presented today for surgery, with the diagnosis of Right Carpal Tunnel Syndrome.  The various methods of treatment have been discussed with the patient and family. After consideration of risks, benefits and other options for treatment, the patient has consented to  Procedure(s) with comments: Right CARPAL TUNNEL RELEASE (Right) - with local as a surgical intervention.  The patient's history has been reviewed, patient examined, no change in status, stable for surgery.  I have reviewed the patient's chart and labs.  Questions were answered to the patient's satisfaction.     Gomez Cleverly

## 2021-09-23 NOTE — Op Note (Signed)
OPERATIVE NOTE  DATE OF PROCEDURE: 09/23/2021  SURGEON: Izell Parke, MD  PREOPERATIVE DIAGNOSIS: Right Carpal Tunnel Syndrome  POSTOPERATIVE DIAGNOSIS: Same  NAME OF PROCEDURE: Right Carpal Tunnel Release  ANESTHESIA: Local + MAC  SKIN PREPARATION: Hibiclens  ESTIMATED BLOOD LOSS: Minimal  IMPLANTS: none  INDICATIONS:  Kelly Charles is a 45 y.o. female who presents with right carpal tunnel syndrome, refractory to nonoperative treatment. The patient has decided to proceed with surgical intervention.  Risks, benefits and alternatives of operative management were discussed including, but not limited to, risks of anesthesia complications, infection, pain, persistent symptoms, stiffness, need for future surgery.  The patient understands, agrees and elects to proceed with surgery.    DESCRIPTION OF PROCEDURE: The patient was placed in the usual supine position and the right upper extremity was prepped and draped in normal sterile fashion.  After local block anesthetic to the right hand and wrist, a standard 1.5 cm incision was made in the midpalm.  This was carried down through the subcutaneous tissues and palmar fascia to the transverse carpal ligament.  The distal one-half of the transverse carpal ligament was incised longitudinally under direct vision using a 15 blade.  The carpal tunnel release guide was then placed under direct vision on the transverse carpal ligament and slid proximally.  The guide was palpated into appropriate alignment longitudinally.  Contact with the transverse carpal ligament was maintained throughout passing.  The blade was engaged into the guide and the remaining portion of the transverse carpal ligament released completely.  No other abnormalities were noted.  The wound was copiously irrigated and the skin closed using horizontal mattress 4-0 nylon sutures.  A light bulky dressing was placed.  The patient tolerated the procedure well and returned to the recovery room in  stable condition.  I was present for the entire surgical procedure.   Matt Holmes, MD

## 2021-09-23 NOTE — Transfer of Care (Signed)
Immediate Anesthesia Transfer of Care Note  Patient: Kelly Charles  Procedure(s) Performed: Right CARPAL TUNNEL RELEASE (Right: Wrist)  Patient Location: Short Stay  Anesthesia Type:MAC  Level of Consciousness: awake, alert , oriented and patient cooperative  Airway & Oxygen Therapy: Patient Spontanous Breathing  Post-op Assessment: Report given to RN and Post -op Vital signs reviewed and stable  Post vital signs: Reviewed and stable  Last Vitals:  Vitals Value Taken Time  BP    Temp    Pulse    Resp    SpO2      Last Pain:  Vitals:   09/23/21 1120  TempSrc: Oral  PainSc: 0-No pain      Patients Stated Pain Goal: 5 (27/25/36 6440)  Complications: No notable events documented.

## 2021-09-23 NOTE — Anesthesia Preprocedure Evaluation (Signed)
Anesthesia Evaluation  Patient identified by MRN, date of birth, ID band Patient awake    Reviewed: Allergy & Precautions, NPO status , Patient's Chart, lab work & pertinent test results  Airway Mallampati: II  TM Distance: >3 FB Neck ROM: Full    Dental   Pulmonary neg pulmonary ROS,    breath sounds clear to auscultation       Cardiovascular negative cardio ROS   Rhythm:Regular Rate:Normal     Neuro/Psych  Neuromuscular disease    GI/Hepatic negative GI ROS, Neg liver ROS,   Endo/Other  diabetes, Type 2, Oral Hypoglycemic Agents  Renal/GU negative Renal ROS     Musculoskeletal   Abdominal   Peds  Hematology negative hematology ROS (+)   Anesthesia Other Findings   Reproductive/Obstetrics                             Anesthesia Physical Anesthesia Plan  ASA: 2  Anesthesia Plan: MAC   Post-op Pain Management: Celebrex PO (pre-op), Tylenol PO (pre-op) and Minimal or no pain anticipated   Induction:   PONV Risk Score and Plan: 2 and Propofol infusion, Ondansetron and Treatment may vary due to age or medical condition  Airway Management Planned: Natural Airway and Simple Face Mask  Additional Equipment:   Intra-op Plan:   Post-operative Plan:   Informed Consent: I have reviewed the patients History and Physical, chart, labs and discussed the procedure including the risks, benefits and alternatives for the proposed anesthesia with the patient or authorized representative who has indicated his/her understanding and acceptance.       Plan Discussed with: Surgeon  Anesthesia Plan Comments:         Anesthesia Quick Evaluation

## 2021-09-23 NOTE — Discharge Instructions (Addendum)
Orthopaedic Hand Surgery Discharge Instructions  WEIGHT BEARING STATUS: Non weight bearing on operative extremity  INCISION CARE: Keep dressing over your incision clean and dry until 5 days after surgery. You may shower by placing a waterproof covering over your dressing. Once dressing is removed, you may allow water to run over the incision and then place Band-Aids over incision. Do not scrub your incision or apply creams/lotions. Do not submerge your incision or swim for 3 weeks after surgery. Contact your surgeon or primary care doctor if you develop redness or drainage from your incision.   PAIN CONTROL: First line medications for post operative pain control are Tylenol (acetaminophen) and Motrin (ibuprofen) if you are able to take these medications. If you have been prescribed a medication these can be taken as breakthrough pain medications. Please note that some narcotic pain medication have acetaminophen added and you should never consume more than 4,000mg  of acetaminophen in 24 hour period. Also please note that if you are given Toradol (ketoralac) you should not take similar medications simultaneously such as ibuprofen.   ICE/ELEVATION: Ice and elevate your injured extremity as needed. Avoid direct contact of ice with skin.  HOME MEDICATIONS: No changes have been made to your home medications.  FOLLOW UP: You will be called after surgery with an appointment date and time, however if you have not received a phone call within 3 days please call during regular office hours at [phone] to schedule a post operative appointment.  Please Seek Medical Attention if: Call MD for: pain or pressure in chest, jaw, arm, back, neck  Call MD for: temperature greater than 101 F for more than 24 hours  Call MD for: difficulty breathing Call MD for: Incision redness, bleeding, drainage  Call MD for: palpitations or feeling that the heart is racing  Call MD for: increased swelling in arm, leg, ankle,  or abdomen  Call MD for: lightheadedness, dizziness, fainting Go to ED or call 911 if: chest pain does not go away after 3 nitroglycerin doses taken 5 min apart  Go to ED or call 911 for: any uncontrolled bleeding  Go to ED or call 911 if: unable to reach physician  Discharge Medications: Allergies as of 09/23/2021       Reactions   Levaquin [levofloxacin] Anaphylaxis   Hives, dyspepsia, shortness of breath   Penicillins Rash        Medication List     TAKE these medications    busPIRone 10 MG tablet Commonly known as: BUSPAR Take 1 tablet (10 mg total) by mouth 2 (two) times daily. What changed: how much to take   DULoxetine 60 MG capsule Commonly known as: CYMBALTA TAKE 1 CAPSULE BY MOUTH EVERY DAY   Eszopiclone 3 MG Tabs TAKE 1 TABLET BY MOUTH EVERY DAY AT BEDTIME AS NEEDED FOR SLEEP   HYDROcodone-acetaminophen 5-325 MG tablet Commonly known as: NORCO/VICODIN Take 1 tablet by mouth every 6 (six) hours as needed for up to 3 days for moderate pain.   Magnesium 200 MG Tabs Take by mouth at bedtime.   metFORMIN 1000 MG tablet Commonly known as: GLUCOPHAGE TAKE 1 TABLET BY MOUTH EVERY DAY WITH BREAKFAST   Vitamin D (Ergocalciferol) 1.25 MG (50000 UNIT) Caps capsule Commonly known as: DRISDOL Take 1 capsule (50,000 Units total) by mouth every 7 (seven) days.          Mathis Dad, MD Orthopaedic Hand Surgeon [facility] Office number: [phone] [address], [address] [place], [place] [address]  Post Anesthesia Home Care Instructions  Activity: Get plenty of rest for the remainder of the day. A responsible individual must stay with you for 24 hours following the procedure.  For the next 24 hours, DO NOT: -Drive a car -Advertising copywriter -Drink alcoholic beverages -Take any medication unless instructed by your physician -Make any legal decisions or sign important papers.  Meals: Start with liquid foods such as gelatin or soup. Progress to  regular foods as tolerated. Avoid greasy, spicy, heavy foods. If nausea and/or vomiting occur, drink only clear liquids until the nausea and/or vomiting subsides. Call your physician if vomiting continues.  Special Instructions/Symptoms: Your throat may feel dry or sore from the anesthesia or the breathing tube placed in your throat during surgery. If this causes discomfort, gargle with warm salt water. The discomfort should disappear within 24 hours.  If you had a scopolamine patch placed behind your ear for the management of post- operative nausea and/or vomiting:  1. The medication in the patch is effective for 72 hours, after which it should be removed.  Wrap patch in a tissue and discard in the trash. Wash hands thoroughly with soap and water. 2. You may remove the patch earlier than 72 hours if you experience unpleasant side effects which may include dry mouth, dizziness or visual disturbances. 3. Avoid touching the patch. Wash your hands with soap and water after contact with the patch.  Do not take any Tylenol or nonsteroidal anti inflammatories until after 5:30 pm today.

## 2021-09-24 ENCOUNTER — Encounter (HOSPITAL_BASED_OUTPATIENT_CLINIC_OR_DEPARTMENT_OTHER): Payer: Self-pay | Admitting: Orthopedic Surgery

## 2021-09-24 NOTE — Anesthesia Postprocedure Evaluation (Signed)
Anesthesia Post Note  Patient: Kelly Charles  Procedure(s) Performed: Right CARPAL TUNNEL RELEASE (Right: Wrist)     Patient location during evaluation: PACU Anesthesia Type: MAC Level of consciousness: awake and alert Pain management: pain level controlled Vital Signs Assessment: post-procedure vital signs reviewed and stable Respiratory status: spontaneous breathing, nonlabored ventilation, respiratory function stable and patient connected to nasal cannula oxygen Cardiovascular status: stable and blood pressure returned to baseline Postop Assessment: no apparent nausea or vomiting Anesthetic complications: no   No notable events documented.  Last Vitals:  Vitals:   09/23/21 1348 09/23/21 1430  BP: 124/83 133/87  Pulse:  93  Resp:  16  Temp:  (!) 36.4 C  SpO2:  96%    Last Pain:  Vitals:   09/23/21 1430  TempSrc: Oral  PainSc: 0-No pain                 Tiajuana Amass

## 2021-10-06 NOTE — Progress Notes (Signed)
Name: Kelly Charles  MRN/ DOB: [id number], [birth year]    Age/ Sex: 45 y.o., female    PCP: [name], MD   Reason for Endocrinology Evaluation: Subclinical Hyperthyroidism      Date of Initial Endocrinology Evaluation: 10/07/2021     HPI: Kelly Charles is a 45 y.o. female with a past medical history of T2DM, trigeminal neuralgia. The patient presented for initial endocrinology clinic visit on 10/07/2021 for consultative assistance with her Subclinical Hyperthyroidism .   She was found to have a low TSH at 0.332 UIU/mL during  work up for anxiety in 07/2021 with normal FT4 at 1.25 ng/dL and normal FT3 at 2.7 pg/mL    Of note, the pt has a hx of hyperthyroidism in 2009 with an uptake of 16.7 % in 05/2008- was following with Dr. Talmage Nap    Weight continues to Fluctuate  Denies loose stools or diarrhea  Has palpitations  Has chronic anxiety  Denies local neck symptoms   LMP 11/5  regular   Mother , maternal aunts and maternal grandmother  with thyroid disease   No biotin intake  No prior exposure to radiation to the head/neck     HISTORY:  Past Medical History:  Past Medical History:  Diagnosis Date   Anxiety    Carpal tunnel syndrome on both sides    Depression    Diabetes mellitus without complication (HCC)    DM2   Hx of chest pain 08/11/2020   Pt was taken via EMS to ER. No abnormal labs, cxray, or EKG (sinus tach). Pain was determined not to be emergent so patient left after 8 hours.She followed up with PCP on 08/12/21. No chest pain or SOB since as of 09/21/21. Pt stated that she was told the problem may be related to her thyroid. She has an endocrinology appt on 10/07/21 per pt.   Hyperthyroidism 2009   radioactive iodine   Trigeminal neuralgia    Past Surgical History:  Past Surgical History:  Procedure Laterality Date   CARPAL TUNNEL RELEASE Right 09/23/2021   Procedure: Right CARPAL TUNNEL RELEASE;  Surgeon: Gomez Cleverly, MD;  Location: [facility];  Service: Orthopedics;  Laterality: Right;  with local   CEREBRAL MICROVASCULAR DECOMPRESSION Right 10/01/2015   [facility]  Dr. Tempie Donning   TUBAL LIGATION  2008   WISDOM TOOTH EXTRACTION  2008    Social History:  reports that she has never smoked. She has never used smokeless tobacco. She reports that she does not drink alcohol and does not use drugs. Family History: family history includes Aneurysm in her mother; Breast cancer in her maternal aunt; Cancer in her father, maternal aunt, and maternal grandfather; Diabetes in her father, maternal aunt, maternal uncle, and mother; Hypertension in her maternal aunt and maternal uncle; Mental illness in her maternal aunt and mother; Thyroid disease in her maternal aunt.   HOME MEDICATIONS: Allergies as of 10/07/2021       Reactions   Levaquin [levofloxacin] Anaphylaxis   Hives, dyspepsia, shortness of breath   Penicillins Rash        Medication List        Accurate as of October 07, 2021 12:18 PM. If you have any questions, ask your nurse or doctor.          atenolol 25 MG tablet Commonly known as: TENORMIN Take 1 tablet (25 mg total) by mouth daily. Started by: Scarlette Shorts, MD   busPIRone 10 MG tablet  Commonly known as: BUSPAR Take 1 tablet (10 mg total) by mouth 2 (two) times daily. What changed: how much to take   DULoxetine 60 MG capsule Commonly known as: CYMBALTA TAKE 1 CAPSULE BY MOUTH EVERY DAY   Eszopiclone 3 MG Tabs TAKE 1 TABLET BY MOUTH EVERY DAY AT BEDTIME AS NEEDED FOR SLEEP   Magnesium 200 MG Tabs Take by mouth at bedtime.   metFORMIN 1000 MG tablet Commonly known as: GLUCOPHAGE TAKE 1 TABLET BY MOUTH EVERY DAY WITH BREAKFAST   Vitamin D (Ergocalciferol) 1.25 MG (50000 UNIT) Caps capsule Commonly known as: DRISDOL Take 1 capsule (50,000 Units total) by mouth every 7 (seven) days.          REVIEW OF SYSTEMS: A comprehensive ROS was conducted  with the patient and is negative except as per HPI    OBJECTIVE:  VS: BP 118/72 (BP Location: Left Arm, Patient Position: Sitting, Cuff Size: Small)    Pulse 100    Ht 5\' 5"  (1.651 m)    Wt 223 lb (101.2 kg)    LMP 09/18/2021 (Exact Date) Comment: per pt   SpO2 99%    BMI 37.11 kg/m    Wt Readings from Last 3 Encounters:  10/07/21 223 lb (101.2 kg)  09/23/21 222 lb 9.6 oz (101 kg)  08/19/21 223 lb 3.2 oz (101.2 kg)     EXAM: General: Pt appears well and is in NAD  Neck: General: Supple without adenopathy. Thyroid: Thyroid size normal.  No goiter or nodules appreciated.   Lungs: Clear with good BS bilat with no rales, rhonchi, or wheezes  Heart: Auscultation: RRR.  Abdomen: Normoactive bowel sounds, soft, nontender, without masses or organomegaly palpable  Extremities:  BL LE: No pretibial edema normal ROM and strength.  Mental Status: Judgment, insight: Intact Orientation: Oriented to time, place, and person Mood and affect: No depression, anxiety, or agitation     DATA REVIEWED:   Latest Reference Range & Units 10/07/21 08:58  TSH 0.35 - 5.50 uIU/mL 0.52  Triiodothyronine,Free,Serum 2.3 - 4.2 pg/mL 3.5  T4,Free(Direct) 0.60 - 1.60 ng/dL 10/09/21      Latest Reference Range & Units 10/07/21 08:58  Sodium 135 - 145 mEq/L 136  Potassium 3.5 - 5.1 mEq/L 4.8  Chloride 96 - 112 mEq/L 104  CO2 19 - 32 mEq/L 25  Glucose 70 - 99 mg/dL 93  BUN 6 - 23 mg/dL 12  Creatinine 10/09/21 - 2.35 mg/dL 5.73  Calcium 8.4 - 2.20 mg/dL 9.6  Alkaline Phosphatase 39 - 117 U/L 47  Albumin 3.5 - 5.2 g/dL 4.1  AST 0 - 37 U/L 9  ALT 0 - 35 U/L 8  Total Protein 6.0 - 8.3 g/dL 7.5  Total Bilirubin 0.2 - 1.2 mg/dL 0.8  GFR 25.4 mL/min 95.48    Latest Reference Range & Units 10/07/21 08:58  WBC 4.0 - 10.5 K/uL 5.9  RBC 3.87 - 5.11 Mil/uL 5.09  Hemoglobin 12.0 - 15.0 g/dL 10/09/21 (L)  HCT 23.7 - 62.8 % 35.0 (L)  MCV 78.0 - 100.0 fl 68.7 Repeated and verified X2. (L)  MCHC 30.0 - 36.0 g/dL 31.5   RDW 17.6 - 16.0 % 17.7 (H)  Platelets 150.0 - 400.0 K/uL 383.0  Neutrophils 43.0 - 77.0 % 59.1  Lymphocytes 12.0 - 46.0 % 34.1  Monocytes Relative 3.0 - 12.0 % 6.3  Eosinophil 0.0 - 5.0 % 0.1  Basophil 0.0 - 3.0 % 0.4  NEUT# 1.4 - 7.7 K/uL 3.5  Lymphocyte #  0.7 - 4.0 K/uL 2.0  Monocyte # 0.1 - 1.0 K/uL 0.4  Eosinophils Absolute 0.0 - 0.7 K/uL 0.0  Basophils Absolute 0.0 - 0.1 K/uL 0.0    Thyroid Uptake and Scan 05/2008   1.  Normal R A I uptake of 16.7%.  2.  Normal nuclear medicine thyroid scan.  No hot or cold nodule is  seen.   ASSESSMENT/PLAN/RECOMMENDATIONS:   Subclinical Hyperthyroidism:   -She has had a history of low TSH in 2009, with normal thyroid uptake and scan through Dr. Cleon Gustin -The patient has nonspecific symptoms -We discussed differential diagnosis of Graves' disease, autonomous thyroid nodule, and normal variant -Approximately 3% of healthy african americans have a serum TSH of < 0.4 uIU/mL due to a shift in the distribution of TSH values in normal people of african american descent.  -We will repeat her TFTs, TR AB, and will proceed with thyroid ultrasound -We will consider thyroid uptake and scan if needed in the future -We discussed with pt the benefits of methimazole in the Tx of hyperthyroidism, as well as the possible side effects/complications of anti-thyroid drug Tx (specifically detailing the rare, but serious side effect of agranulocytosis). She was informed of need for regular thyroid function monitoring while on methimazole to ensure appropriate dosage without over-treatment. As well, we discussed the possible side effects of methimazole including the chance of rash, the small chance of liver irritation/juandice and the <=1 in 300-400 chance of sudden onset agranulocytosis.  We discussed importance of going to ED promptly (and stopping methimazole) if shewere to develop significant fever with severe sore throat of other evidence of acute infection.      -We extensively discussed the various treatment options for hyperthyroidism and Graves disease including ablation therapy with radioactive iodine versus antithyroid drug treatment versus surgical therapy. -I carefully explained to the patient that one of the consequences of I-131 ablation treatment would likely be permanent hypothyroidism which would require long-term replacement therapy with LT4.  - TFT's are normal and will not offer any treatment at this time    2. Tachycardia :  -Given her minimally low TSH, I cannot explain her tachycardia with current thyroid readings.  I am going to start her on a beta-blocker ,the patient eventually will need to be evaluated cardiology    Medication Start atenolol 25 mg daily     Follow-up in 4 months  Signed electronically by: Lyndle Herrlich, MD  [facility] Endocrinology  [facility] [address] [place] [place], [place] [address] Phone: [phone] FAX: [phone]   CC: [name], MD [address] [place] [address] Phone: [phone] Fax: [phone]   Return to Endocrinology clinic as below: Future Appointments  Date Time Provider Department Center  12/06/2021  9:00 AM [name], MD [facility] None  02/09/2022  8:30 AM Cele Mote, Konrad Dolores, MD [id number] None

## 2021-10-07 ENCOUNTER — Encounter: Payer: Self-pay | Admitting: Internal Medicine

## 2021-10-07 ENCOUNTER — Ambulatory Visit (INDEPENDENT_AMBULATORY_CARE_PROVIDER_SITE_OTHER): Payer: 59 | Admitting: Internal Medicine

## 2021-10-07 ENCOUNTER — Other Ambulatory Visit: Payer: Self-pay

## 2021-10-07 VITALS — BP 118/72 | HR 100 | Ht 65.0 in | Wt 223.0 lb

## 2021-10-07 DIAGNOSIS — E0590 Thyrotoxicosis, unspecified without thyrotoxic crisis or storm: Secondary | ICD-10-CM | POA: Insufficient documentation

## 2021-10-07 DIAGNOSIS — R000 Tachycardia, unspecified: Secondary | ICD-10-CM

## 2021-10-07 LAB — COMPREHENSIVE METABOLIC PANEL
ALT: 8 U/L (ref 0–35)
AST: 9 U/L (ref 0–37)
Albumin: 4.1 g/dL (ref 3.5–5.2)
Alkaline Phosphatase: 47 U/L (ref 39–117)
BUN: 12 mg/dL (ref 6–23)
CO2: 25 mEq/L (ref 19–32)
Calcium: 9.6 mg/dL (ref 8.4–10.5)
Chloride: 104 mEq/L (ref 96–112)
Creatinine, Ser: 0.76 mg/dL (ref 0.40–1.20)
GFR: 95.48 mL/min (ref >60.00)
Glucose, Bld: 93 mg/dL (ref 70–99)
Potassium: 4.8 mEq/L (ref 3.5–5.1)
Sodium: 136 mEq/L (ref 135–145)
Total Bilirubin: 0.8 mg/dL (ref 0.2–1.2)
Total Protein: 7.5 g/dL (ref 6.0–8.3)

## 2021-10-07 LAB — CBC WITH DIFFERENTIAL/PLATELET
Basophils Absolute: 0 10*3/uL (ref 0.0–0.1)
Basophils Relative: 0.4 % (ref 0.0–3.0)
Eosinophils Absolute: 0 10*3/uL (ref 0.0–0.7)
Eosinophils Relative: 0.1 % (ref 0.0–5.0)
HCT: 35 % — ABNORMAL LOW (ref 36.0–46.0)
Hemoglobin: 10.6 g/dL — ABNORMAL LOW (ref 12.0–15.0)
Lymphocytes Relative: 34.1 % (ref 12.0–46.0)
Lymphs Abs: 2 10*3/uL (ref 0.7–4.0)
MCHC: 30.3 g/dL (ref 30.0–36.0)
MCV: 68.7 fl — ABNORMAL LOW (ref 78.0–100.0)
Monocytes Absolute: 0.4 10*3/uL (ref 0.1–1.0)
Monocytes Relative: 6.3 % (ref 3.0–12.0)
Neutro Abs: 3.5 10*3/uL (ref 1.4–7.7)
Neutrophils Relative %: 59.1 % (ref 43.0–77.0)
Platelets: 383 10*3/uL (ref 150.0–400.0)
RBC: 5.09 Mil/uL (ref 3.87–5.11)
RDW: 17.7 % — ABNORMAL HIGH (ref 11.5–15.5)
WBC: 5.9 10*3/uL (ref 4.0–10.5)

## 2021-10-07 LAB — TSH: TSH: 0.52 u[IU]/mL (ref 0.35–5.50)

## 2021-10-07 LAB — T4, FREE: Free T4: 0.84 ng/dL (ref 0.60–1.60)

## 2021-10-07 LAB — T3, FREE: T3, Free: 3.5 pg/mL (ref 2.3–4.2)

## 2021-10-07 MED ORDER — ATENOLOL 25 MG PO TABS: 25.0000 mg | ORAL_TABLET | Freq: Every day | ORAL | 3 refills | Status: DC

## 2021-10-07 NOTE — Patient Instructions (Signed)
Start Atenlol 25 mg daily to slow down your heart rate

## 2021-10-11 LAB — TRAB (TSH RECEPTOR BINDING ANTIBODY): TRAB: <1 IU/L (ref <=2.00)

## 2021-10-15 ENCOUNTER — Other Ambulatory Visit: Payer: Self-pay | Admitting: Internal Medicine

## 2021-10-15 ENCOUNTER — Ambulatory Visit
Admission: RE | Admit: 2021-10-15 | Discharge: 2021-10-15 | Disposition: A | Discharge status: Home / Self Care | Payer: 59 | Source: Ambulatory Visit | Attending: Internal Medicine | Admitting: Internal Medicine

## 2021-10-15 DIAGNOSIS — E0590 Thyrotoxicosis, unspecified without thyrotoxic crisis or storm: Secondary | ICD-10-CM

## 2021-12-06 ENCOUNTER — Encounter: Payer: 59 | Admitting: Internal Medicine

## 2021-12-13 ENCOUNTER — Other Ambulatory Visit: Payer: Self-pay | Admitting: Internal Medicine

## 2021-12-13 DIAGNOSIS — F5101 Primary insomnia: Secondary | ICD-10-CM

## 2022-02-09 ENCOUNTER — Ambulatory Visit: Payer: Managed Care, Other (non HMO) | Admitting: Internal Medicine

## 2022-02-09 ENCOUNTER — Encounter: Payer: Self-pay | Admitting: Internal Medicine

## 2022-02-09 VITALS — BP 110/68 | HR 86 | Ht 65.0 in | Wt 225.0 lb

## 2022-02-09 DIAGNOSIS — R000 Tachycardia, unspecified: Secondary | ICD-10-CM | POA: Diagnosis not present

## 2022-02-09 DIAGNOSIS — E0590 Thyrotoxicosis, unspecified without thyrotoxic crisis or storm: Secondary | ICD-10-CM

## 2022-02-09 LAB — T4, FREE: Free T4: 0.86 ng/dL (ref 0.60–1.60)

## 2022-02-09 LAB — TSH: TSH: 0.47 u[IU]/mL (ref 0.35–5.50)

## 2022-02-09 NOTE — Progress Notes (Signed)
? ? ?Name: Kelly Charles  ?MRN/ DOB: [id number], 1976-03-05    ?Age/ Sex: 46 y.o., female   ? ?PCP: [name], MD   ?Reason for Endocrinology Evaluation: Subclinical Hyperthyroidism   ?   ?Date of Initial Endocrinology Evaluation: 10/07/2021  ? ? ?HPI: ?Kelly Charles is a 46 y.o. female with a past medical history of T2DM, trigeminal neuralgia. The patient presented for initial endocrinology clinic visit on 10/07/2021 for consultative assistance with her Subclinical Hyperthyroidism .  ? ?She was found to have a low TSH at 0.332 UIU/mL during  work up for anxiety in 07/2021 with normal FT4 at 1.25 ng/dL and normal FT3 at 2.7 pg/mL  ? ? ?Of note, the pt has a hx of hyperthyroidism in 2009 with an uptake of 16.7 % in 05/2008- was following with Dr. Talmage Nap  ? ? ?Mother , maternal aunts and maternal grandmother  with thyroid disease  ? ?No biotin intake  ?No prior exposure to radiation to the head/neck  ? ? ? ?SUBJECTIVE:  ? ? ? ?Today (02/09/22):  Kelly Charles is here for a follow up on  ? ?Weight continues to Fluctuate  ?Denies loose stools or diarrhea  ?Has palpitations , atenolol has helped  ?Has chronic anxiety - worsening  ?Denies local neck symptoms  ? ?LMP 11/5  regular  ? ? ? ? ?HISTORY:  ?Past Medical History:  ?Past Medical History:  ?Diagnosis Date  ? Anxiety   ? Carpal tunnel syndrome on both sides   ? Depression   ? Diabetes mellitus without complication (HCC)   ? DM2  ? Hx of chest pain 08/11/2020  ? Pt was taken via EMS to ER. No abnormal labs, cxray, or EKG (sinus tach). Pain was determined not to be emergent so patient left after 8 hours.She followed up with PCP on 08/12/21. No chest pain or SOB since as of 09/21/21. Pt stated that she was told the problem may be related to her thyroid. She has an endocrinology appt on 10/07/21 per pt.  ? Hyperthyroidism 2009  ? radioactive iodine  ? Trigeminal neuralgia   ? ?Past Surgical History:  ?Past Surgical  History:  ?Procedure Laterality Date  ? CARPAL TUNNEL RELEASE Right 09/23/2021  ? Procedure: Right CARPAL TUNNEL RELEASE;  Surgeon: Gomez Cleverly, MD;  Location[facility];  Service: Orthopedics;  Laterality: Right;  with local  ? CEREBRAL MICROVASCULAR DECOMPRESSION Right 10/01/2015  ? [facility]  Dr. Tempie Donning  ? TUBAL LIGATION  2008  ? WISDOM TOOTH EXTRACTION  2008  ?  ?Social History:  reports that she has never smoked. She has never used smokeless tobacco. She reports that she does not drink alcohol and does not use drugs. ?Family History: family history includes Aneurysm in her mother; Breast cancer in her maternal aunt; Cancer in her father, maternal aunt, and maternal grandfather; Diabetes in her father, maternal aunt, maternal uncle, and mother; Hypertension in her maternal aunt and maternal uncle; Mental illness in her maternal aunt and mother; Thyroid disease in her maternal aunt. ? ? ?HOME MEDICATIONS: ?Allergies as of 02/09/2022   ? ?   Reactions  ? Levaquin [levofloxacin] Anaphylaxis  ? Hives, dyspepsia, shortness of breath  ? Penicillins Rash  ? ?  ? ?  ?Medication List  ?  ? ?  ? Accurate as of February 09, 2022  4:33 PM. If you have any questions, ask your nurse or doctor.  ?  ?  ? ?  ? ?  atenolol 25 MG tablet ?Commonly known as: TENORMIN ?Take 1 tablet (25 mg total) by mouth daily. ?  ?busPIRone 10 MG tablet ?Commonly known as: BUSPAR ?Take 1 tablet (10 mg total) by mouth 2 (two) times daily. ?What changed: how much to take ?  ?DULoxetine 60 MG capsule ?Commonly known as: CYMBALTA ?TAKE 1 CAPSULE BY MOUTH EVERY DAY ?  ?Eszopiclone 3 MG Tabs ?TAKE 1 TABLET BY MOUTH AT BEDTIME AS NEEDED FOR SLEEP ?  ?Magnesium 200 MG Tabs ?Take by mouth at bedtime. ?  ?metFORMIN 1000 MG tablet ?Commonly known as: GLUCOPHAGE ?TAKE 1 TABLET BY MOUTH EVERY DAY WITH BREAKFAST ?  ?Vitamin D (Ergocalciferol) 1.25 MG (50000 UNIT) Caps capsule ?Commonly known as: DRISDOL ?TAKE 1 CAPSULE (50,000 UNITS TOTAL) BY MOUTH EVERY  7 (SEVEN) DAYS ?  ? ?  ?  ? ? ?REVIEW OF SYSTEMS: ?A comprehensive ROS was conducted with the patient and is negative except as per HPI  ? ? ?OBJECTIVE:  ?VS: BP 110/68 (BP Location: Left Arm, Patient Position: Sitting, Cuff Size: Small)   Pulse 86   Ht 5\' 5"  (1.651 m)   Wt 225 lb (102.1 kg)   SpO2 99%   BMI 37.44 kg/m?   ? ?Wt Readings from Last 3 Encounters:  ?02/09/22 225 lb (102.1 kg)  ?10/07/21 223 lb (101.2 kg)  ?09/23/21 222 lb 9.6 oz (101 kg)  ? ? ? ?EXAM: ?General: Pt appears well and is in NAD  ?Neck: General: Supple without adenopathy. ?Thyroid: Thyroid size normal.  No goiter or nodules appreciated.   ?Lungs: Clear with good BS bilat with no rales, rhonchi, or wheezes  ?Heart: Auscultation: RRR.  ?Abdomen: Normoactive bowel sounds, soft, nontender, without masses or organomegaly palpable  ?Extremities:  ?BL LE: No pretibial edema normal ROM and strength.  ?Mental Status: Judgment, insight: Intact ?Orientation: Oriented to time, place, and person ?Mood and affect: No depression, anxiety, or agitation  ? ? ? ?DATA REVIEWED: ? Latest Reference Range & Units 02/09/22 09:06  ?TSH 0.35 - 5.50 uIU/mL 0.47  ?T4,Free(Direct) 0.60 - 1.60 ng/dL 02/11/22  ? ? ? ?Thyroid Uptake and Scan 05/2008 ? ? ?1.  Normal R A I uptake of 16.7%.  ?2.  Normal nuclear medicine thyroid scan.  No hot or cold nodule is  ?seen.  ? ?ASSESSMENT/PLAN/RECOMMENDATIONS:  ? ?Subclinical Hyperthyroidism: ? ? ?-She has had a history of low TSH in 2009, with normal thyroid uptake and scan through Dr. 2010 ?-We discussed differential diagnosis of Graves' disease, autonomous thyroid nodule, and normal variant as well as essay interference  ?-Approximately 3% of healthy african americans have a serum TSH of < 0.4 uIU/mL due to a shift in the distribution of TSH values in normal people of african american descent.  ?-Repeat TFTs continue to be normal ? ? ?2. Tachycardia : ? ?-This is persistent despite normalization of thyroid function, will refer  to cardiology  ? ? ? ?Medication ?Continue atenolol 25 mg daily ? ? ? ? ?Follow-up as needed ? ?Signed electronically by: ?Abby Cleon Gustin, MD ? ?[facility]  ?[facility] ?[address] ?[place], [place] ?Phone: [phone] ?FAX: [phone] ? ? ?CC: ?[phone], MD ?[address] ?[place] [address] ?Phone: [phone] ?Fax: [phone] ? ? ?Return to Endocrinology clinic as below: ?Future Appointments  ?Date Time Provider Department Center  ?03/23/2022  3:40 PM 05/23/2022, MD [facility] None  ?  ? ? ? ? ? ?

## 2022-02-10 LAB — T3: T3, Total: 122 ng/dL (ref 76–181)

## 2022-03-15 ENCOUNTER — Other Ambulatory Visit: Payer: Self-pay | Admitting: Internal Medicine

## 2022-03-15 DIAGNOSIS — F5101 Primary insomnia: Secondary | ICD-10-CM

## 2022-03-21 ENCOUNTER — Encounter: Payer: Self-pay | Admitting: Cardiology

## 2022-03-21 ENCOUNTER — Ambulatory Visit: Payer: Commercial Managed Care - HMO | Admitting: Cardiology

## 2022-03-21 VITALS — BP 112/74 | HR 91 | Temp 97.6°F | Resp 16 | Ht 65.0 in | Wt 226.0 lb

## 2022-03-21 DIAGNOSIS — R000 Tachycardia, unspecified: Secondary | ICD-10-CM

## 2022-03-21 DIAGNOSIS — E119 Type 2 diabetes mellitus without complications: Secondary | ICD-10-CM

## 2022-03-21 DIAGNOSIS — R011 Cardiac murmur, unspecified: Secondary | ICD-10-CM

## 2022-03-21 DIAGNOSIS — E7800 Pure hypercholesterolemia, unspecified: Secondary | ICD-10-CM

## 2022-03-21 DIAGNOSIS — Z6837 Body mass index (BMI) 37.0-37.9, adult: Secondary | ICD-10-CM

## 2022-03-21 IMAGING — MG MM DIGITAL SCREENING BILAT W/ TOMO AND CAD
6 of 10 series · 6 of 30 positions shown · non-contrast
Comparison: Previous exam(s).

CLINICAL DATA: Screening.

EXAM:
DIGITAL SCREENING BILATERAL MAMMOGRAM WITH TOMOSYNTHESIS AND CAD
TECHNIQUE: Bilateral screening digital craniocaudal and mediolateral oblique
mammograms were obtained. Bilateral screening digital breast
tomosynthesis was performed. The images were evaluated with
computer-aided detection.

[R CC synth-2D]
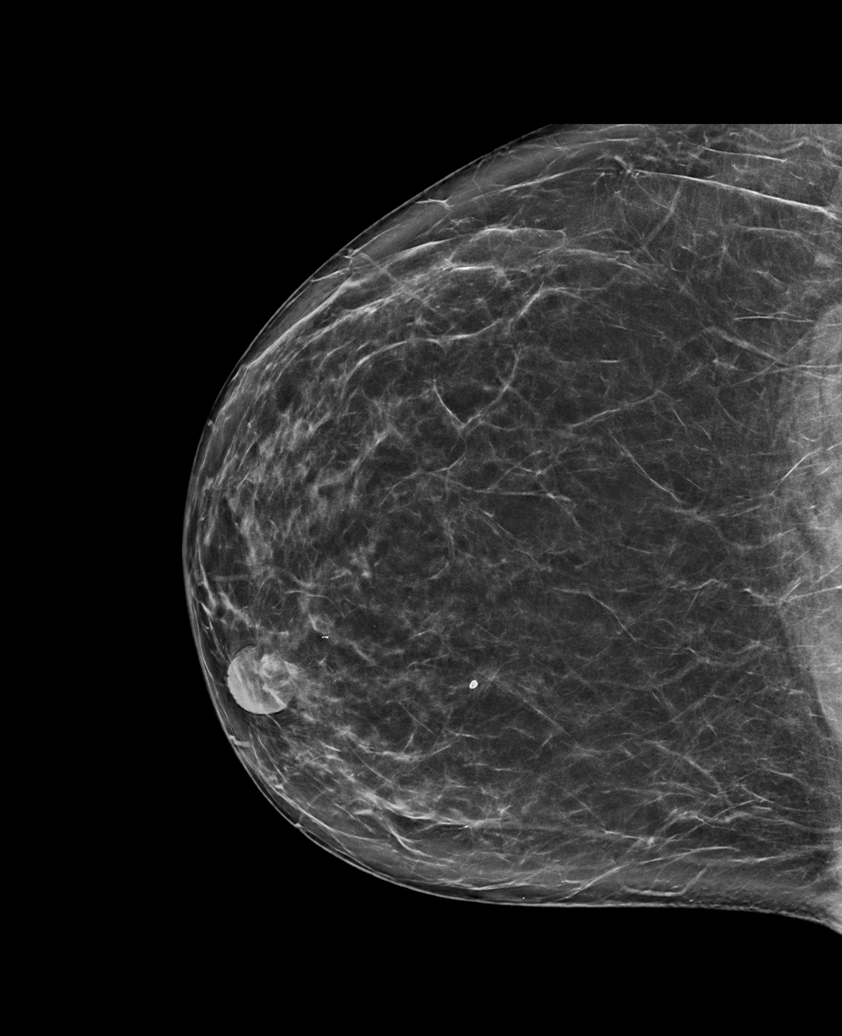

[L CC synth-2D]
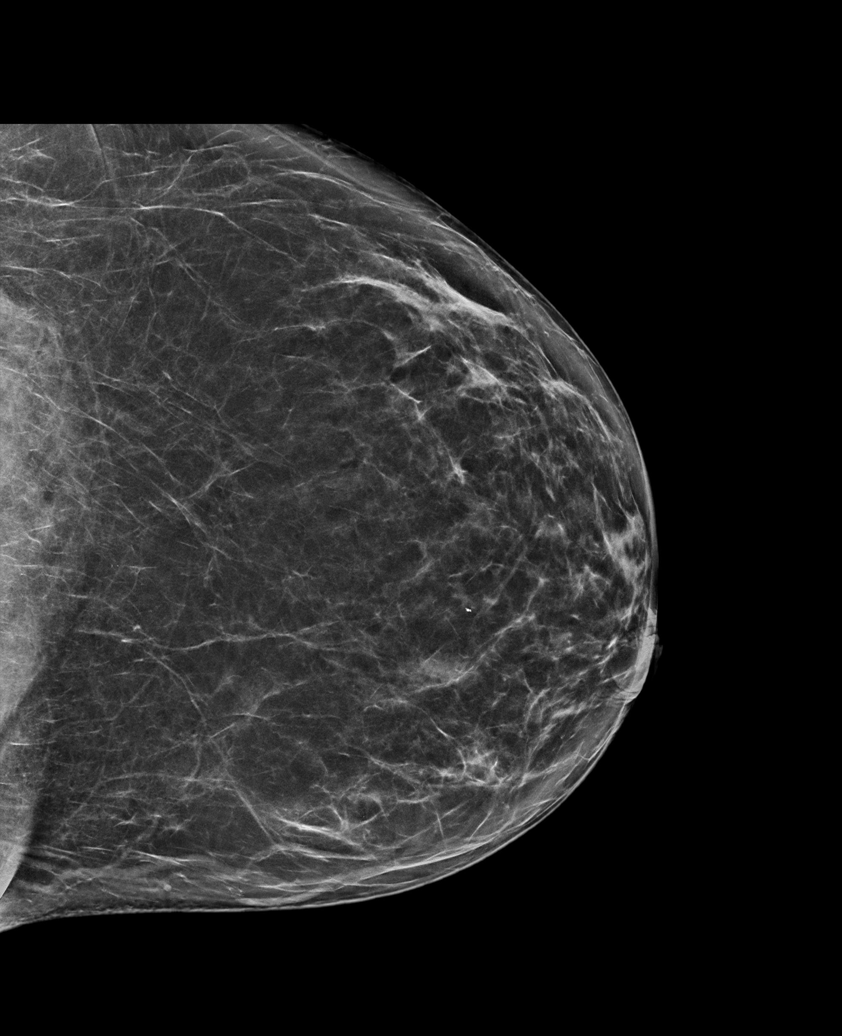

[R MLO synth-2D (1 of 2)]
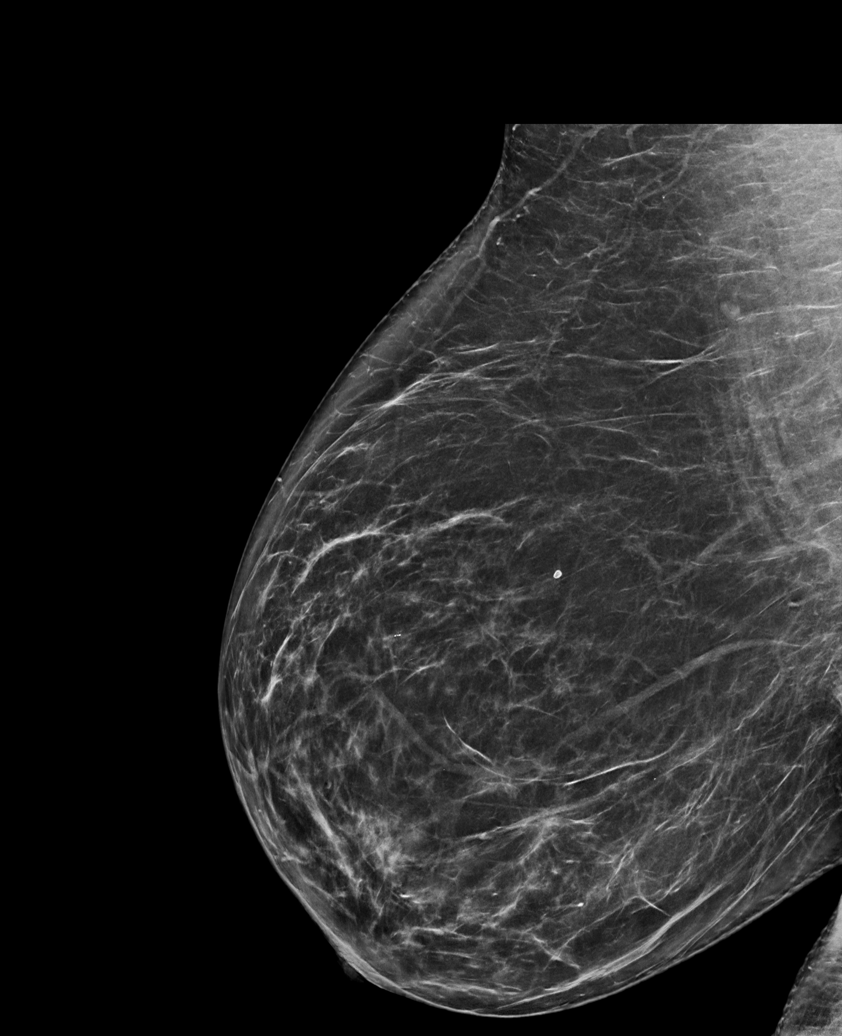

[R MLO synth-2D (2 of 2)]
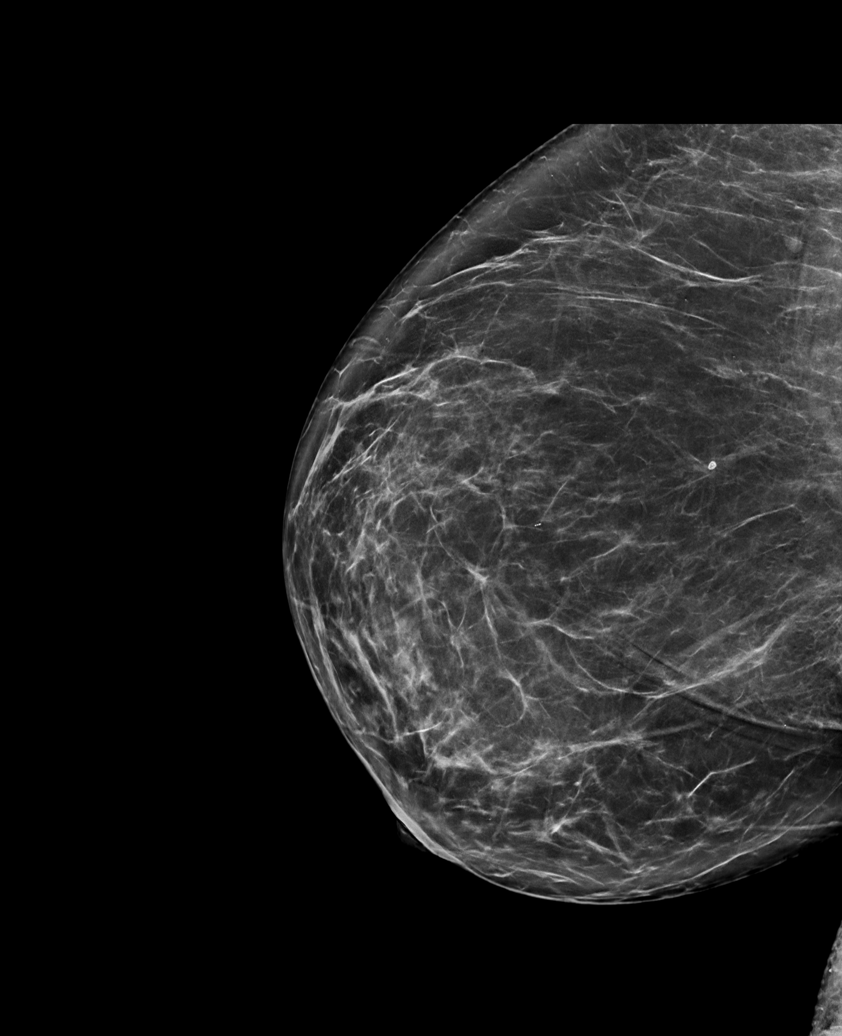

[L MLO synth-2D]
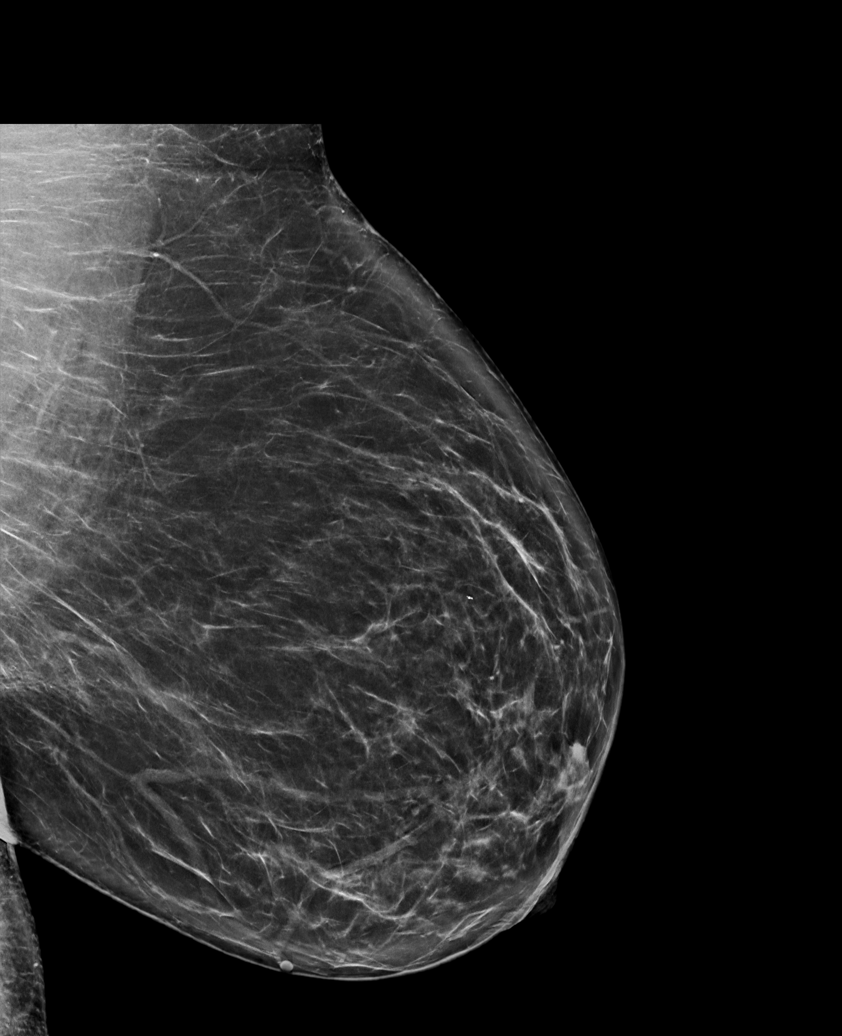

[R CC tomo · tomo slice 41/81.0]
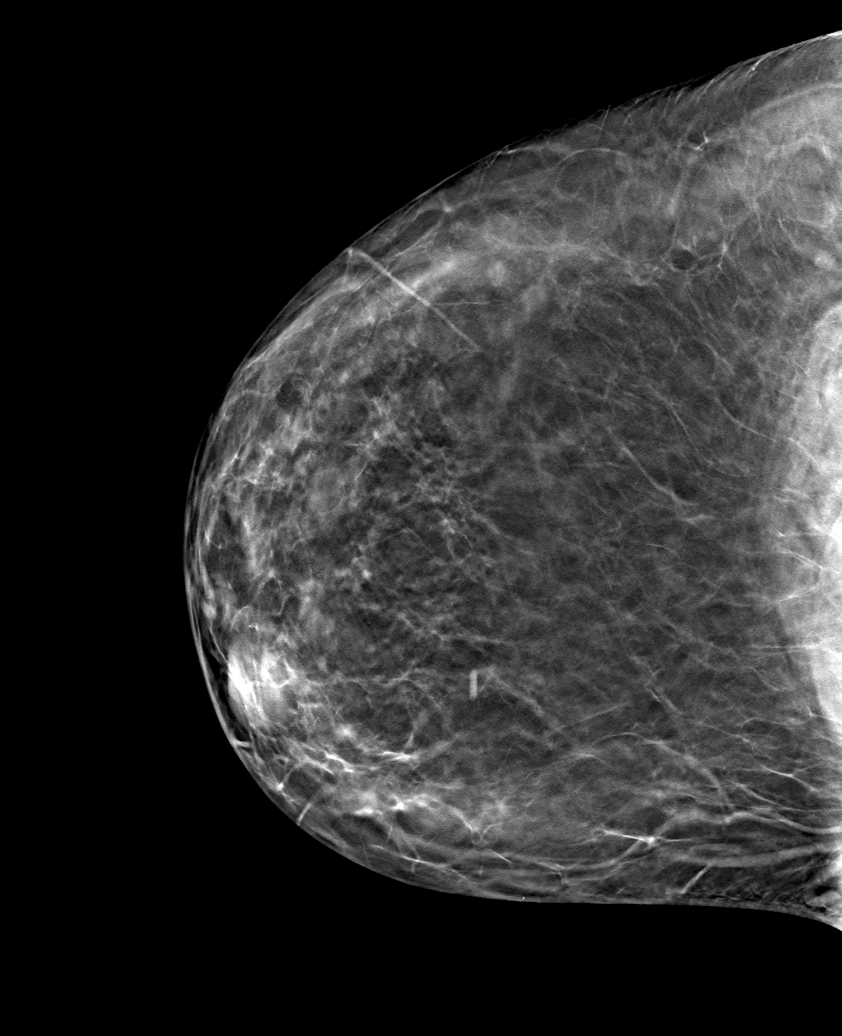

[6 of 30 positions shown; findings below may reference images not displayed]

ACR Breast Density Category b: There are scattered areas of
fibroglandular density.
FINDINGS: There are no findings suspicious for malignancy.
IMPRESSION: No mammographic evidence of malignancy. A result letter of this
screening mammogram will be mailed directly to the patient.

RECOMMENDATION:
Screening mammogram in one year. (Code:51-O-LD2)

BI-RADS CATEGORY  1: Negative.

## 2022-03-21 NOTE — Progress Notes (Signed)
Primary Physician/Referring:  [name], MD  Patient ID: Kelly Charles, female    DOB: 12-Jul-1976, 46 y.o.   MRN: [id number]  Chief Complaint  Patient presents with   New Patient (Initial Visit)   Tachycardia   HPI:    Kelly Charles  is a 46 y.o. African-American female patient with diabetes mellitus, obesity, previous hypothyroidism and now being treated for hypothyroidism, referred to me by Dr. Lonzo Cloud, Konrad Dolores for evaluation of resting sinus tachycardia.  Except for occasional episodes of palpitations, patient is asymptomatic.  Does admit to being sedentary.  States that since she was started on atenolol, she has not had any further symptoms.  Tolerating the medication well.  Past Medical History:  Diagnosis Date   Anxiety    Carpal tunnel syndrome on both sides    Depression    Diabetes mellitus without complication (HCC)    DM2   Hx of chest pain 08/11/2020   Pt was taken via EMS to ER. No abnormal labs, cxray, or EKG (sinus tach). Pain was determined not to be emergent so patient left after 8 hours.She followed up with PCP on 08/12/21. No chest pain or SOB since as of 09/21/21. Pt stated that she was told the problem may be related to her thyroid. She has an endocrinology appt on 10/07/21 per pt.   Hyperthyroidism 2009   radioactive iodine   Trigeminal neuralgia    Past Surgical History:  Procedure Laterality Date   CARPAL TUNNEL RELEASE Right 09/23/2021   Procedure: Right CARPAL TUNNEL RELEASE;  Surgeon: Gomez Cleverly, MD;  Location: [facility] [place];  Service: Orthopedics;  Laterality: Right;  with local   CEREBRAL MICROVASCULAR DECOMPRESSION Right 10/01/2015   [facility]  Dr. Tempie Donning   TUBAL LIGATION  2008   WISDOM TOOTH EXTRACTION  2008   Family History  Problem Relation Age of Onset   Cancer Maternal Grandfather        BLADDER   Cancer Father        PROSTATE   Diabetes Father    Diabetes Mother    Mental illness  Mother        ANXIETY   Aneurysm Mother        Small cerebral aneurysm   Thyroid disease Maternal Aunt    Cancer Maternal Aunt        LUNG   Hypertension Maternal Uncle    Diabetes Maternal Uncle    Hypertension Maternal Aunt    Diabetes Maternal Aunt    Mental illness Maternal Aunt    Breast cancer Maternal Aunt     Social History   Tobacco Use   Smoking status: Never   Smokeless tobacco: Never  Substance Use Topics   Alcohol use: No   Marital Status: Married  ROS  Review of Systems  Cardiovascular:  Negative for chest pain, dyspnea on exertion and leg swelling.  Objective  Blood pressure 112/74, pulse 91, temperature 97.6 F (36.4 C), temperature source Temporal, resp. rate 16, height 5\' 5"  (1.651 m), weight 226 lb (102.5 kg), SpO2 99 %. Body mass index is 37.61 kg/m.     03/21/2022   11:43 AM 02/09/2022    8:28 AM 10/07/2021    8:26 AM  Vitals with BMI  Height 5\' 5"  5\' 5"  5\' 5"   Weight 226 lbs 225 lbs 223 lbs  BMI 37.61 37.44 37.11  Systolic 112 110 10/09/2021  Diastolic 74 68 72  Pulse 91 86 100    Physical Exam  Neck:     Vascular: No JVD.  Cardiovascular:     Rate and Rhythm: Normal rate and regular rhythm.     Pulses: Intact distal pulses.     Heart sounds: Normal heart sounds. No murmur heard.   No gallop.  Pulmonary:     Effort: Pulmonary effort is normal.     Breath sounds: Normal breath sounds.  Abdominal:     General: Bowel sounds are normal.     Palpations: Abdomen is soft.  Musculoskeletal:     Right lower leg: No edema.     Left lower leg: No edema.    Medications and allergies   Allergies  Allergen Reactions   Levaquin [Levofloxacin] Anaphylaxis    Hives, dyspepsia, shortness of breath   Penicillins Rash     Medication list after today's encounter   Current Outpatient Medications:    atenolol (TENORMIN) 25 MG tablet, Take 1 tablet (25 mg total) by mouth daily., Disp: 90 tablet, Rfl: 3   busPIRone (BUSPAR) 10 MG tablet, Take 1 tablet (10  mg total) by mouth 2 (two) times daily. (Patient taking differently: Take 5 mg by mouth 2 (two) times daily.), Disp: 180 tablet, Rfl: 2   DULoxetine (CYMBALTA) 60 MG capsule, TAKE 1 CAPSULE BY MOUTH EVERY DAY, Disp: 90 capsule, Rfl: 1   Eszopiclone 3 MG TABS, TAKE 1 TABLET BY MOUTH EVERY DAY AT BEDTIME AS NEEDED FOR SLEEP, Disp: 30 tablet, Rfl: 2   metFORMIN (GLUCOPHAGE) 1000 MG tablet, TAKE 1 TABLET BY MOUTH EVERY DAY WITH BREAKFAST, Disp: 90 tablet, Rfl: 1   OVER THE COUNTER MEDICATION, Supplement for stress, Disp: , Rfl:    Vitamin D, Ergocalciferol, (DRISDOL) 1.25 MG (50000 UNIT) CAPS capsule, TAKE 1 CAPSULE (50,000 UNITS TOTAL) BY MOUTH EVERY 7 (SEVEN) DAYS, Disp: 13 capsule, Rfl: 3   Magnesium 200 MG TABS, Take by mouth at bedtime., Disp: , Rfl:   Laboratory examination:   Recent Labs    06/07/21 1244 08/11/21 1327 09/23/21 1127 10/07/21 0858  NA 138 136 138 136  K 4.7 3.9 4.5 4.8  CL 102 106 103 104  CO2 18* 22  --  25  GLUCOSE 76 83 103* 93  BUN 9 8 11 12   CREATININE 0.77 0.76 0.60 0.76  CALCIUM 9.3 9.2  --  9.6  GFRNONAA  --  >60  --   --    CrCl cannot be calculated (Patient's most recent lab result is older than the maximum 21 days allowed.).     Latest Ref Rng & Units 10/07/2021    8:58 AM 09/23/2021   11:27 AM 08/11/2021    1:27 PM  CMP  Glucose 70 - 99 mg/dL 93   08/13/2021   83    BUN 6 - 23 mg/dL 12   11   8     Creatinine 0.40 - 1.20 mg/dL 062     3.76    Sodium 135 - 145 mEq/L 136   138   136    Potassium 3.5 - 5.1 mEq/L 4.8   4.5   3.9    Chloride 96 - 112 mEq/L 104   103   106    CO2 19 - 32 mEq/L 25    22    Calcium 8.4 - 10.5 mg/dL 9.6    9.2    Total Protein 6.0 - 8.3 g/dL 7.5      Total Bilirubin 0.2 - 1.2 mg/dL 0.8      Alkaline Phos  39 - 117 U/L 47      AST 0 - 37 U/L 9      ALT 0 - 35 U/L 8          Latest Ref Rng & Units 10/07/2021    8:58 AM 09/23/2021   11:27 AM 08/11/2021    1:27 PM  CBC  WBC 4.0 - 10.5 K/uL 5.9    6.0    Hemoglobin  12.0 - 15.0 g/dL 16.110.6   09.612.9   04.511.2    Hematocrit 36.0 - 46.0 % 35.0   38.0   36.6    Platelets 150.0 - 400.0 K/uL 383.0    476     Lab Results  Component Value Date   CHOL 224 (H) 11/30/2020   HDL 60 11/30/2020   LDLCALC 137 (H) 11/30/2020   TRIG 155 (H) 11/30/2020   CHOLHDL 3.7 11/30/2020    HEMOGLOBIN A1C Lab Results  Component Value Date   HGBA1C 6.1 (H) 06/07/2021   TSH Recent Labs    07/19/21 1506 10/07/21 0858 02/09/22 0906  TSH 0.332* 0.52 0.47    Radiology:    Cardiac Studies:   NA  EKG:   EKG 03/21/2022: Normal sinus rhythm at rate of 66 bpm, normal EKG.  Compared to 08/12/2021, sinus tachycardia not present.  Assessment     ICD-10-CM   1. Sinus tachycardia  R00.0 EKG 12-Lead    2. Hypercholesteremia  E78.00     3. Type 2 diabetes mellitus without complication, without long-term current use of insulin (HCC)  E11.9     4. Class 2 severe obesity due to excess calories with serious comorbidity and body mass index (BMI) of 37.0 to 37.9 in adult [facility](HCC)  E66.01    Z68.37        There are no discontinued medications.  No orders of the defined types were placed in this encounter.  Orders Placed This Encounter  Procedures   EKG 12-Lead   Recommendations:   Kelly Charles is a 46 y.o. African-American female patient with diabetes mellitus, obesity, previous hypothyroidism and now being treated for hypothyroidism, referred to me by Dr. Lonzo CloudSHAMLEFFER, Konrad DoloresIBTEHAL JARALLA for evaluation of resting sinus tachycardia in a patient with above risk factors.  Patient is fairly active with regard to her activities of daily living, mother of 2 children, works full-time and also maintains the household.  Denies any chest pain or shortness of breath and has had occasional episodes of elevated heart rate that she notices.  She was started on low-dose of atenolol 25 mg daily recently and since then heart rate has improved and she is essentially asymptomatic with no  palpitations.  EKG today reveals heart rate of 65 bpm and a normal EKG.  From cardiac standpoint, I do not suspect she has any cardiomyopathy or significant coronary artery disease in spite of her risk factors leading to resting sinus tachycardia.  Suspect a combination of thyroid issues although stable recently, morbid obesity, deconditioning and lack of physical activity are all contributing to elevated heart rate along with moderate anemia that has been ongoing for a while.  In December 2022, CBC revealed hemoglobin of 10.  I have recommended that she continue with atenolol 25 mg daily, I will set her up for an echocardiogram to evaluate LV systolic function and also evaluate systolic apical murmur which I suspect could be a flow murmur or mild MR and unless significant abnormality, continued primary prevention is indicated.  I reviewed her labs,  she has been scheduled for complete labs again in 2 days and a visit with Dr. Robyn Sanders, patient in view Dorothyann Pengiabetic, LDL goal <70, would recommend starting her on Lipitor 20 mg daily or simvastatin 40 mg in the evening and potentially consider adding Zetia if LDL is not at goal.  As patient is willing to take a statin, coronary calcium score may not be indicated in a patient who is otherwise asymptomatic and risk factors are controlled.  Exercise and weight loss was discussed at length with the patient.  Unless echocardiogram is abnormal, I will see him back on a as needed basis.    Yates Decamp, MD, [facility] 03/21/2022, 12:23 PM Office: [phone]

## 2022-03-23 ENCOUNTER — Other Ambulatory Visit (HOSPITAL_COMMUNITY)
Admission: RE | Admit: 2022-03-23 | Discharge: 2022-03-23 | Disposition: A | Discharge status: Home / Self Care | Payer: Managed Care, Other (non HMO) | Source: Ambulatory Visit | Attending: Internal Medicine | Admitting: Internal Medicine

## 2022-03-23 ENCOUNTER — Ambulatory Visit (INDEPENDENT_AMBULATORY_CARE_PROVIDER_SITE_OTHER): Payer: Managed Care, Other (non HMO) | Admitting: Internal Medicine

## 2022-03-23 ENCOUNTER — Encounter: Payer: Self-pay | Admitting: Internal Medicine

## 2022-03-23 VITALS — BP 124/80 | HR 82 | Temp 98.3°F | Ht 66.6 in | Wt 226.8 lb

## 2022-03-23 DIAGNOSIS — Z0000 Encounter for general adult medical examination without abnormal findings: Secondary | ICD-10-CM

## 2022-03-23 DIAGNOSIS — Z01419 Encounter for gynecological examination (general) (routine) without abnormal findings: Secondary | ICD-10-CM | POA: Insufficient documentation

## 2022-03-23 DIAGNOSIS — Z1211 Encounter for screening for malignant neoplasm of colon: Secondary | ICD-10-CM

## 2022-03-23 DIAGNOSIS — Z6835 Body mass index (BMI) 35.0-35.9, adult: Secondary | ICD-10-CM | POA: Diagnosis not present

## 2022-03-23 DIAGNOSIS — F3289 Other specified depressive episodes: Secondary | ICD-10-CM | POA: Diagnosis not present

## 2022-03-23 DIAGNOSIS — R7303 Prediabetes: Secondary | ICD-10-CM | POA: Insufficient documentation

## 2022-03-23 LAB — POC HEMOCCULT BLD/STL (OFFICE/1-CARD/DIAGNOSTIC): Fecal Occult Blood, POC: NEGATIVE

## 2022-03-23 MED ORDER — SEMAGLUTIDE-WEIGHT MANAGEMENT 1 MG/0.5ML ~~LOC~~ SOAJ: 1.0000 mg | SUBCUTANEOUS | 0 refills | Status: DC

## 2022-03-23 NOTE — Patient Instructions (Addendum)
The 10-year ASCVD risk score (Arnett DK, et al., 2019) is: 1.8%   Values used to calculate the score:     Age: 46 years     Sex: Female     Is Non-Hispanic African American: Yes     Diabetic: Yes     Tobacco smoker: No     Systolic Blood Pressure: 124 mmHg     Is BP treated: No     HDL Cholesterol: 60 mg/dL     Total Cholesterol: 224 mg/dL   Health Maintenance, Female Adopting a healthy lifestyle and getting preventive care are important in promoting health and wellness. Ask your health care provider about: The right schedule for you to have regular tests and exams. Things you can do on your own to prevent diseases and keep yourself healthy. What should I know about diet, weight, and exercise? Eat a healthy diet  Eat a diet that includes plenty of vegetables, fruits, low-fat dairy products, and lean protein. Do not eat a lot of foods that are high in solid fats, added sugars, or sodium. Maintain a healthy weight Body mass index (BMI) is used to identify weight problems. It estimates body fat based on height and weight. Your health care provider can help determine your BMI and help you achieve or maintain a healthy weight. Get regular exercise Get regular exercise. This is one of the most important things you can do for your health. Most adults should: Exercise for at least 150 minutes each week. The exercise should increase your heart rate and make you sweat (moderate-intensity exercise). Do strengthening exercises at least twice a week. This is in addition to the moderate-intensity exercise. Spend less time sitting. Even light physical activity can be beneficial. Watch cholesterol and blood lipids Have your blood tested for lipids and cholesterol at 45 years of age, then have this test every 5 years. Have your cholesterol levels checked more often if: Your lipid or cholesterol levels are high. You are older than 45 years of age. You are at high risk for heart disease. What should  I know about cancer screening? Depending on your health history and family history, you may need to have cancer screening at various ages. This may include screening for: Breast cancer. Cervical cancer. Colorectal cancer. Skin cancer. Lung cancer. What should I know about heart disease, diabetes, and high blood pressure? Blood pressure and heart disease High blood pressure causes heart disease and increases the risk of stroke. This is more likely to develop in people who have high blood pressure readings or are overweight. Have your blood pressure checked: Every 3-5 years if you are 24-69 years of age. Every year if you are 63 years old or older. Diabetes Have regular diabetes screenings. This checks your fasting blood sugar level. Have the screening done: Once every three years after age 85 if you are at a normal weight and have a low risk for diabetes. More often and at a younger age if you are overweight or have a high risk for diabetes. What should I know about preventing infection? Hepatitis B If you have a higher risk for hepatitis B, you should be screened for this virus. Talk with your health care provider to find out if you are at risk for hepatitis B infection. Hepatitis C Testing is recommended for: Everyone born from [birth year] through 1964. Anyone with known risk factors for hepatitis C. Sexually transmitted infections (STIs) Get screened for STIs, including gonorrhea and chlamydia, if: You are sexually active  and are younger than 45 years of age. You are older than 45 years of age and your health care provider tells you that you are at risk for this type of infection. Your sexual activity has changed since you were last screened, and you are at increased risk for chlamydia or gonorrhea. Ask your health care provider if you are at risk. Ask your health care provider about whether you are at high risk for HIV. Your health care provider may recommend a prescription medicine to help  prevent HIV infection. If you choose to take medicine to prevent HIV, you should first get tested for HIV. You should then be tested every 3 months for as long as you are taking the medicine. Pregnancy If you are about to stop having your period (premenopausal) and you may become pregnant, seek counseling before you get pregnant. Take 400 to 800 micrograms (mcg) of folic acid every day if you become pregnant. Ask for birth control (contraception) if you want to prevent pregnancy. Osteoporosis and menopause Osteoporosis is a disease in which the bones lose minerals and strength with aging. This can result in bone fractures. If you are 48 years old or older, or if you are at risk for osteoporosis and fractures, ask your health care provider if you should: Be screened for bone loss. Take a calcium or vitamin D supplement to lower your risk of fractures. Be given hormone replacement therapy (HRT) to treat symptoms of menopause. Follow these instructions at home: Alcohol use Do not drink alcohol if: Your health care provider tells you not to drink. You are pregnant, may be pregnant, or are planning to become pregnant. If you drink alcohol: Limit how much you have to: 0-1 drink a day. Know how much alcohol is in your drink. In the [place] one drink equals one 12 oz bottle of beer (355 mL), one 5 oz glass of wine (148 mL), or one 1 oz glass of hard liquor (44 mL). Lifestyle Do not use any products that contain nicotine or tobacco. These products include cigarettes, chewing tobacco, and vaping devices, such as e-cigarettes. If you need help quitting, ask your health care provider. Do not use street drugs. Do not share needles. Ask your health care provider for help if you need support or information about quitting drugs. General instructions Schedule regular health, dental, and eye exams. Stay current with your vaccines. Tell your health care provider if: You often feel depressed. You have ever  been abused or do not feel safe at home. Summary Adopting a healthy lifestyle and getting preventive care are important in promoting health and wellness. Follow your health care provider's instructions about healthy diet, exercising, and getting tested or screened for diseases. Follow your health care provider's instructions on monitoring your cholesterol and blood pressure. This information is not intended to replace advice given to you by your health care provider. Make sure you discuss any questions you have with your health care provider. Document Revised: 02/22/2021 Document Reviewed: 02/22/2021 Elsevier Patient Education  [place].

## 2022-03-23 NOTE — Progress Notes (Signed)
Rich Brave Llittleton,acting as a Education administrator for Maximino Greenland, MD.,have documented all relevant documentation on the behalf of Maximino Greenland, MD,as directed by  Maximino Greenland, MD while in the presence of Maximino Greenland, MD.  This visit occurred during the SARS-CoV-2 public health emergency.  Safety protocols were in place, including screening questions prior to the visit, additional usage of staff PPE, and extensive cleaning of exam room while observing appropriate contact time as indicated for disinfecting solutions.  Subjective:     Patient ID: Kelly Charles , female    DOB: January 28, 1976 , 46 y.o.   MRN: [id number]   Chief Complaint  Patient presents with   Annual Exam    HPI  She is here today for a full physical examination. She is not followed by GYN. She has no specific concerns or complaints at this time. She would like to have a pap smear performed today.      Past Medical History:  Diagnosis Date   Anxiety    Carpal tunnel syndrome on both sides    Depression    Diabetes mellitus without complication ([place])    DM2   Hx of chest pain 08/11/2020   Pt was taken via EMS to ER. No abnormal labs, cxray, or EKG (sinus tach). Pain was determined not to be emergent so patient left after 8 hours.She followed up with PCP on 08/12/21. No chest pain or SOB since as of 09/21/21. Pt stated that she was told the problem may be related to her thyroid. She has an endocrinology appt on 10/07/21 per pt.   Hyperthyroidism 2009   radioactive iodine   Trigeminal neuralgia      Family History  Problem Relation Age of Onset   Cancer Maternal Grandfather        BLADDER   Cancer Father        PROSTATE   Diabetes Father    Diabetes Mother    Mental illness Mother        ANXIETY   Aneurysm Mother        Small cerebral aneurysm   Thyroid disease Maternal Aunt    Cancer Maternal Aunt        LUNG   Hypertension Maternal Uncle    Diabetes Maternal Uncle    Hypertension  Maternal Aunt    Diabetes Maternal Aunt    Mental illness Maternal Aunt    Breast cancer Maternal Aunt      Current Outpatient Medications:    atenolol (TENORMIN) 25 MG tablet, Take 1 tablet (25 mg total) by mouth daily., Disp: 90 tablet, Rfl: 3   busPIRone (BUSPAR) 10 MG tablet, Take 1 tablet (10 mg total) by mouth 2 (two) times daily. (Patient taking differently: Take 5 mg by mouth 2 (two) times daily.), Disp: 180 tablet, Rfl: 2   DULoxetine (CYMBALTA) 60 MG capsule, TAKE 1 CAPSULE BY MOUTH EVERY DAY, Disp: 90 capsule, Rfl: 1   Eszopiclone 3 MG TABS, TAKE 1 TABLET BY MOUTH EVERY DAY AT BEDTIME AS NEEDED FOR SLEEP, Disp: 30 tablet, Rfl: 2   metFORMIN (GLUCOPHAGE) 1000 MG tablet, TAKE 1 TABLET BY MOUTH EVERY DAY WITH BREAKFAST, Disp: 90 tablet, Rfl: 1   OVER THE COUNTER MEDICATION, Supplement for stress, Disp: , Rfl:    [START ON 05/20/2022] Semaglutide-Weight Management 1 MG/0.5ML SOAJ, Inject 1 mg into the skin once a week for 28 days., Disp: 2 mL, Rfl: 0   Vitamin D, Ergocalciferol, (DRISDOL) 1.25 MG (50000 UNIT) CAPS  capsule, TAKE 1 CAPSULE (50,000 UNITS TOTAL) BY MOUTH EVERY 7 (SEVEN) DAYS, Disp: 13 capsule, Rfl: 3   Allergies  Allergen Reactions   Levaquin [Levofloxacin] Anaphylaxis    Hives, dyspepsia, shortness of breath   Penicillins Rash      The patient states she uses tubal ligation for birth control. Last LMP was Patient's last menstrual period was 03/16/2022.. Negative for Dysmenorrhea. Negative for: breast discharge, breast lump(s), breast pain and breast self exam. Associated symptoms include abnormal vaginal bleeding. Pertinent negatives include abnormal bleeding (hematology), anxiety, decreased libido, depression, difficulty falling sleep, dyspareunia, history of infertility, nocturia, sexual dysfunction, sleep disturbances, urinary incontinence, urinary urgency, vaginal discharge and vaginal itching. Diet regular.The patient states her exercise level is  intermittent.  .  The patient's tobacco use is:  Social History   Tobacco Use  Smoking Status Never  Smokeless Tobacco Never  . She has been exposed to passive smoke. The patient's alcohol use is:  Social History   Substance and Sexual Activity  Alcohol Use No   Review of Systems  Constitutional: Negative.   HENT: Negative.    Eyes: Negative.   Respiratory: Negative.    Cardiovascular: Negative.   Gastrointestinal: Negative.   Endocrine: Negative.   Genitourinary: Negative.   Musculoskeletal: Negative.   Skin: Negative.   Allergic/Immunologic: Negative.   Neurological: Negative.   Hematological: Negative.   Psychiatric/Behavioral:  Positive for dysphoric mood.      Today's Vitals   03/23/22 1536  BP: 124/80  Pulse: 82  Temp: 98.3 F (36.8 C)  Weight: 226 lb 12.8 oz (102.9 kg)  Height: 5' 6.6" (1.692 m)  PainSc: 0-No pain   Body mass index is 35.95 kg/m.  Wt Readings from Last 3 Encounters:  03/23/22 226 lb 12.8 oz (102.9 kg)  03/21/22 226 lb (102.5 kg)  02/09/22 225 lb (102.1 kg)     Objective:  Physical Exam Vitals and nursing note reviewed. Exam conducted with a chaperone present.  Constitutional:      Appearance: Normal appearance. She is obese.  HENT:     Head: Normocephalic and atraumatic.     Right Ear: Tympanic membrane, ear canal and external ear normal.     Left Ear: Tympanic membrane, ear canal and external ear normal.     Nose: Nose normal.     Mouth/Throat:     Mouth: Mucous membranes are moist.     Pharynx: Oropharynx is clear.  Eyes:     Extraocular Movements: Extraocular movements intact.     Conjunctiva/sclera: Conjunctivae normal.     Pupils: Pupils are equal, round, and reactive to light.  Cardiovascular:     Rate and Rhythm: Normal rate and regular rhythm.     Pulses: Normal pulses.     Heart sounds: Normal heart sounds.  Pulmonary:     Effort: Pulmonary effort is normal.     Breath sounds: Normal breath sounds.  Abdominal:     General: Abdomen  is flat. Bowel sounds are normal.     Palpations: Abdomen is soft.  Genitourinary:    General: Normal vulva.     Exam position: Lithotomy position.     Tanner stage (genital): 5.     Vagina: Normal.     Cervix: Normal.     Uterus: Normal.      Adnexa: Right adnexa normal and left adnexa normal.     Rectum: Normal. Guaiac result negative.  Musculoskeletal:        General: Normal range of motion.  Cervical back: Normal range of motion and neck supple.  Lymphadenopathy:     Lower Body: No right inguinal adenopathy. No left inguinal adenopathy.  Skin:    General: Skin is warm and dry.  Neurological:     General: No focal deficit present.     Mental Status: She is alert and oriented to person, place, and time.  Psychiatric:        Mood and Affect: Mood normal.        Behavior: Behavior normal.     Comments: She is on meds for depression. Her Mom was recently diagnosed with metastatic renal cell CA which has exacerbated her sx. Also feels she lacks motivation. Wants to see Psych for med management.       Assessment And Plan:     1. Encounter for general adult medical examination w/o abnormal findings Comments: A full exam was performed.  Importance of monthly self breast exams was discussed with the patient. PATIENT IS ADVISED TO GET 30-45 MINUTES REGULAR EXERCISE NO LESS THAN FOUR TO FIVE DAYS PER WEEK - BOTH WEIGHTBEARING EXERCISES AND AEROBIC ARE RECOMMENDED.  PATIENT IS ADVISED TO FOLLOW A HEALTHY DIET WITH AT LEAST SIX FRUITS/VEGGIES PER DAY, DECREASE INTAKE OF RED MEAT, AND TO INCREASE FISH INTAKE TO TWO DAYS PER WEEK.  MEATS/FISH SHOULD NOT BE FRIED, BAKED OR BROILED IS PREFERABLE.  IT IS ALSO IMPORTANT TO CUT BACK ON YOUR SUGAR INTAKE. PLEASE AVOID ANYTHING WITH ADDED SUGAR, CORN SYRUP OR OTHER SWEETENERS. IF YOU MUST USE A SWEETENER, YOU CAN TRY STEVIA. IT IS ALSO IMPORTANT TO AVOID ARTIFICIALLY SWEETENERS AND DIET BEVERAGES. LASTLY, I SUGGEST WEARING SPF 50 SUNSCREEN ON EXPOSED  PARTS AND ESPECIALLY WHEN IN THE DIRECT SUNLIGHT FOR AN EXTENDED PERIOD OF TIME.  PLEASE AVOID FAST FOOD RESTAURANTS AND INCREASE YOUR WATER INTAKE. - CBC - CMP14+EGFR - Lipid panel - Hemoglobin A1c  2. Pap smear, low-risk Comments: Pap smear performed, stool heme negative.  - Cytology -Pap Smear  3. Other depression Comments: She is interested in changing her meds. Would like Psychiatry referral.  - Ambulatory referral to Psychiatry  4. Class 2 severe obesity due to excess calories with serious comorbidity and body mass index (BMI) of 35.0 to 35.9 in adult [facility]) We discussed the use of Wegovy for obesity. She denies family/personal h/o thyroid cancer. I will send rx to start PA process. She is reminded to stop eating when full. She will rto in 4 weeks after start meds, if approved.  Possible side effects d/w patient.    5. Encounter for Hemoccult screening - POC Hemoccult Bld/Stl (1-Cd Office Dx)  Patient was given opportunity to ask questions. Patient verbalized understanding of the plan and was able to repeat key elements of the plan. All questions were answered to their satisfaction.   I, Maximino Greenland, MD, have reviewed all documentation for this visit. The documentation on 03/23/22 for the exam, diagnosis, procedures, and orders are all accurate and complete.   THE PATIENT IS ENCOURAGED TO PRACTICE SOCIAL DISTANCING DUE TO THE COVID-19 PANDEMIC.

## 2022-03-24 LAB — CBC
Hematocrit: 34 % (ref 34.0–46.6)
Hemoglobin: 10.9 g/dL — ABNORMAL LOW (ref 11.1–15.9)
MCH: 22.2 pg — ABNORMAL LOW (ref 26.6–33.0)
MCHC: 32.1 g/dL (ref 31.5–35.7)
MCV: 69 fL — ABNORMAL LOW (ref 79–97)
Platelets: 430 10*3/uL (ref 150–450)
RBC: 4.91 x10E6/uL (ref 3.77–5.28)
RDW: 15.9 % — ABNORMAL HIGH (ref 11.7–15.4)
WBC: 7 10*3/uL (ref 3.4–10.8)

## 2022-03-24 LAB — CMP14+EGFR
ALT: 12 IU/L (ref 0–32)
AST: 10 IU/L (ref 0–40)
Albumin/Globulin Ratio: 1.3 (ref 1.2–2.2)
Albumin: 4.3 g/dL (ref 3.8–4.8)
Alkaline Phosphatase: 56 IU/L (ref 44–121)
BUN/Creatinine Ratio: 12 (ref 9–23)
BUN: 10 mg/dL (ref 6–24)
Bilirubin Total: 0.5 mg/dL (ref 0.0–1.2)
CO2: 23 mmol/L (ref 20–29)
Calcium: 9.5 mg/dL (ref 8.7–10.2)
Chloride: 103 mmol/L (ref 96–106)
Creatinine, Ser: 0.86 mg/dL (ref 0.57–1.00)
Globulin, Total: 3.4 g/dL (ref 1.5–4.5)
Glucose: 84 mg/dL (ref 70–99)
Potassium: 4.5 mmol/L (ref 3.5–5.2)
Sodium: 138 mmol/L (ref 134–144)
Total Protein: 7.7 g/dL (ref 6.0–8.5)
eGFR: 85 mL/min/{1.73_m2} (ref >59)

## 2022-03-24 LAB — LIPID PANEL
Chol/HDL Ratio: 3.2 ratio (ref 0.0–4.4)
Cholesterol, Total: 177 mg/dL (ref 100–199)
HDL: 56 mg/dL (ref >39)
LDL Chol Calc (NIH): 106 mg/dL — ABNORMAL HIGH (ref 0–99)
Triglycerides: 81 mg/dL (ref 0–149)
VLDL Cholesterol Cal: 15 mg/dL (ref 5–40)

## 2022-03-24 LAB — HEMOGLOBIN A1C
Est. average glucose Bld gHb Est-mCnc: 126 mg/dL
Hgb A1c MFr Bld: 6 % — ABNORMAL HIGH (ref 4.8–5.6)

## 2022-03-28 LAB — CYTOLOGY - PAP
Comment: NEGATIVE
Diagnosis: UNDETERMINED — AB
High risk HPV: NEGATIVE

## 2022-03-30 ENCOUNTER — Ambulatory Visit: Payer: Commercial Managed Care - HMO

## 2022-03-30 DIAGNOSIS — R011 Cardiac murmur, unspecified: Secondary | ICD-10-CM

## 2022-03-30 DIAGNOSIS — R000 Tachycardia, unspecified: Secondary | ICD-10-CM

## 2022-04-13 ENCOUNTER — Encounter: Payer: Self-pay | Admitting: Internal Medicine

## 2022-04-14 ENCOUNTER — Other Ambulatory Visit: Payer: Self-pay

## 2022-04-14 MED ORDER — SEMAGLUTIDE-WEIGHT MANAGEMENT 1 MG/0.5ML ~~LOC~~ SOAJ: 1.0000 mg | SUBCUTANEOUS | 0 refills | Status: AC

## 2022-04-22 ENCOUNTER — Telehealth: Payer: Self-pay | Admitting: Physician Assistant

## 2022-04-22 DIAGNOSIS — R3989 Other symptoms and signs involving the genitourinary system: Secondary | ICD-10-CM

## 2022-04-22 MED ORDER — SULFAMETHOXAZOLE-TRIMETHOPRIM 800-160 MG PO TABS: 1.0000 | ORAL_TABLET | Freq: Two times a day (BID) | ORAL | 0 refills | Status: DC

## 2022-04-22 NOTE — Progress Notes (Signed)

## 2022-05-10 IMAGING — US US THYROID
1 series · 14 of 25 positions shown · non-contrast
Comparison: None.

CLINICAL DATA: Hyperthyroidism

EXAM:
THYROID ULTRASOUND
TECHNIQUE: Ultrasound examination of the thyroid gland and adjacent soft
tissues was performed.

[Series 1: us thyroid · 0.06mm/px · 14 of 34 slices shown]
[im 1/34]
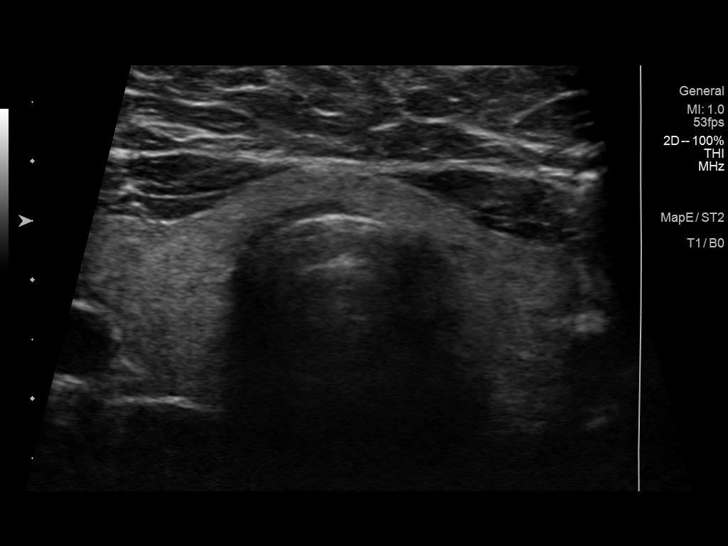
[im 3/34]
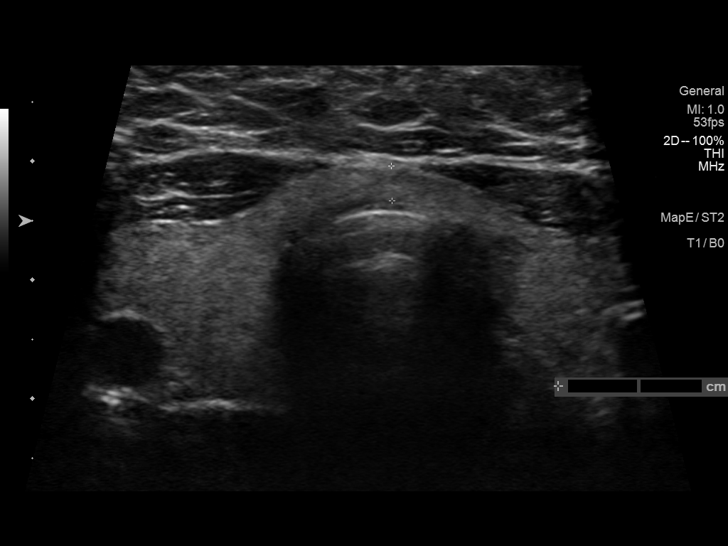
[im 6/34]
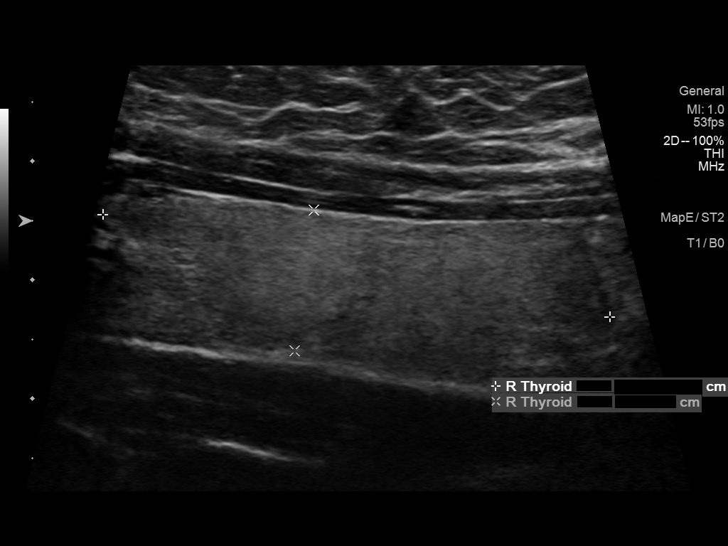
[im 9/34]
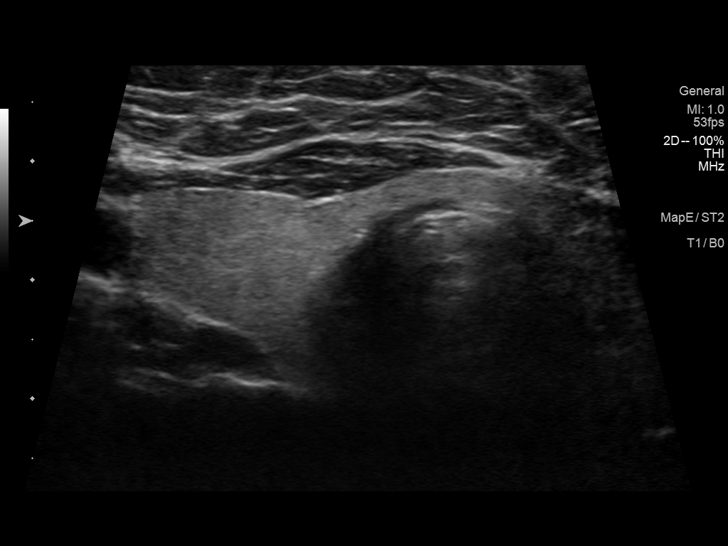
[im 12/34]
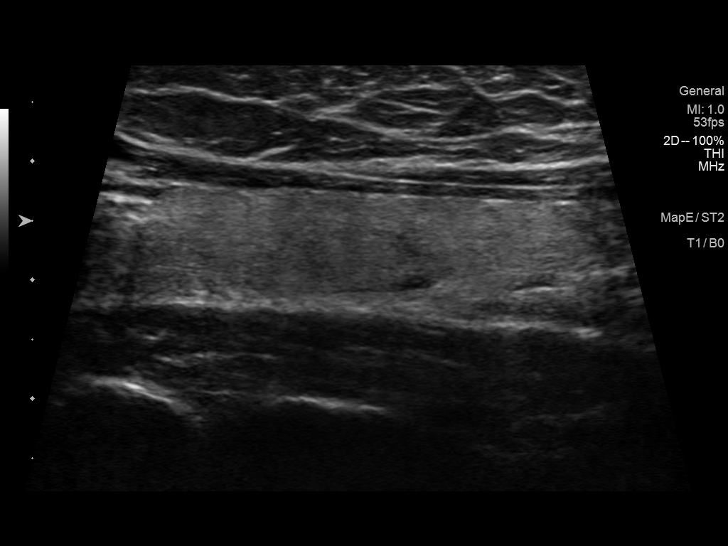
[im 13/34]
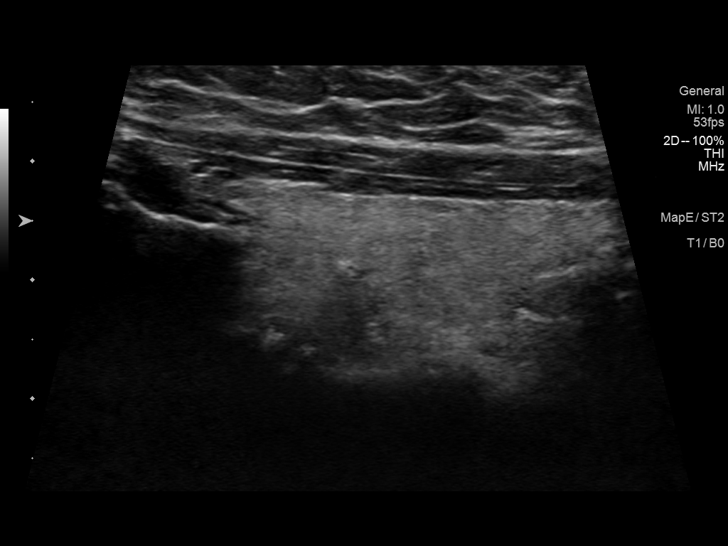
[im 16/34]
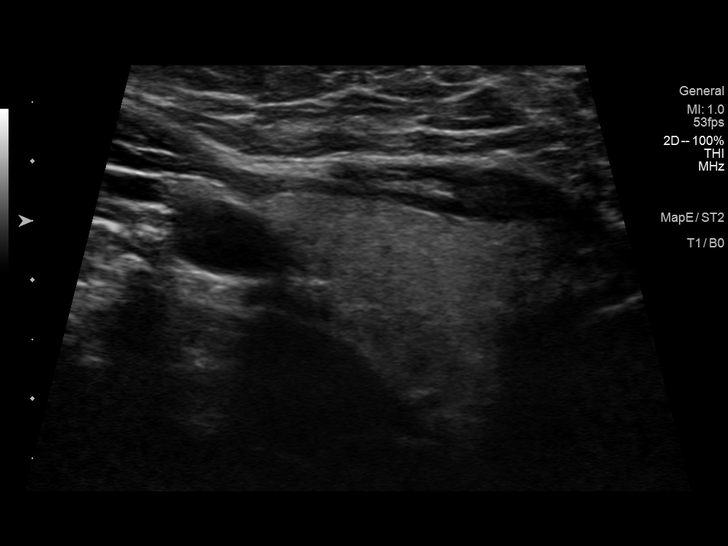
[im 18/34]
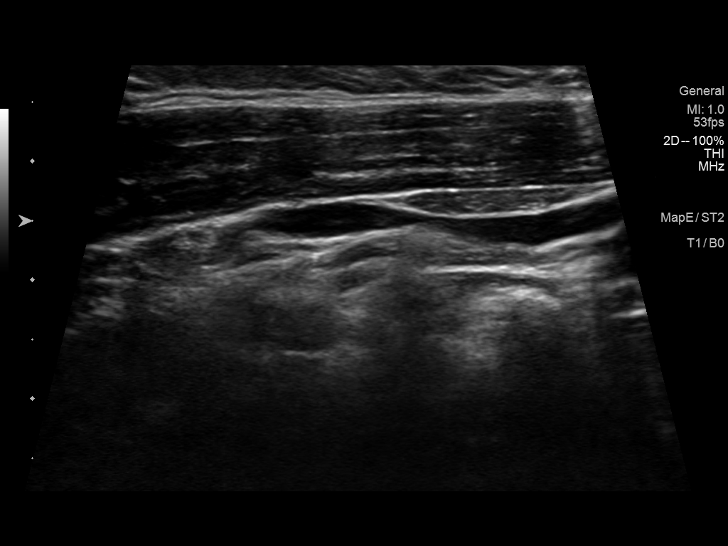
[im 21/34]
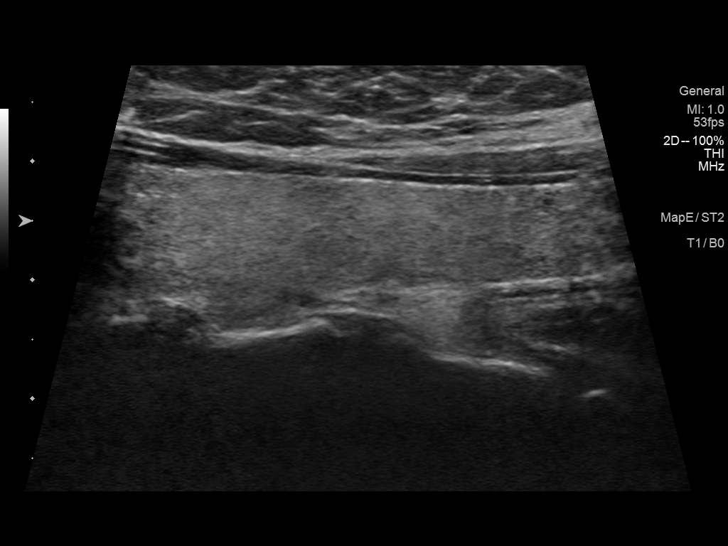
[im 23/34]
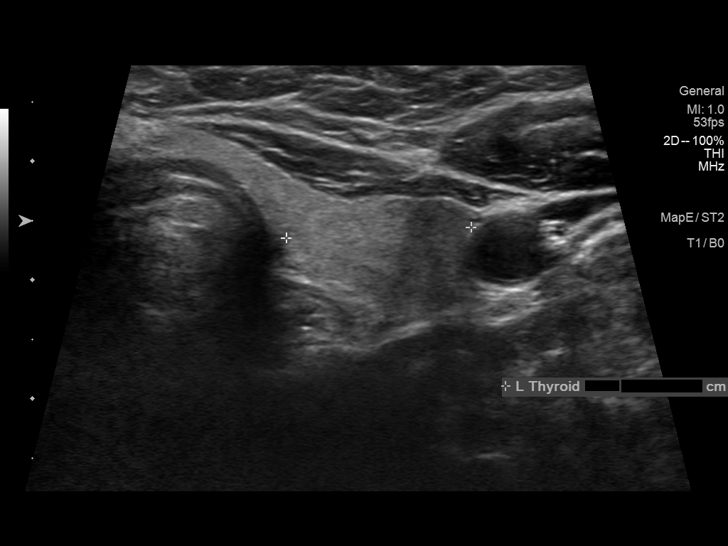
[im 25/34]
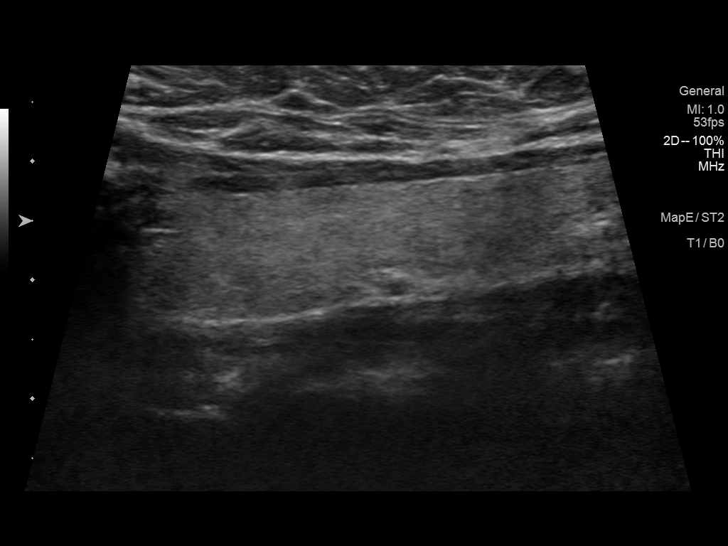
[im 28/34]
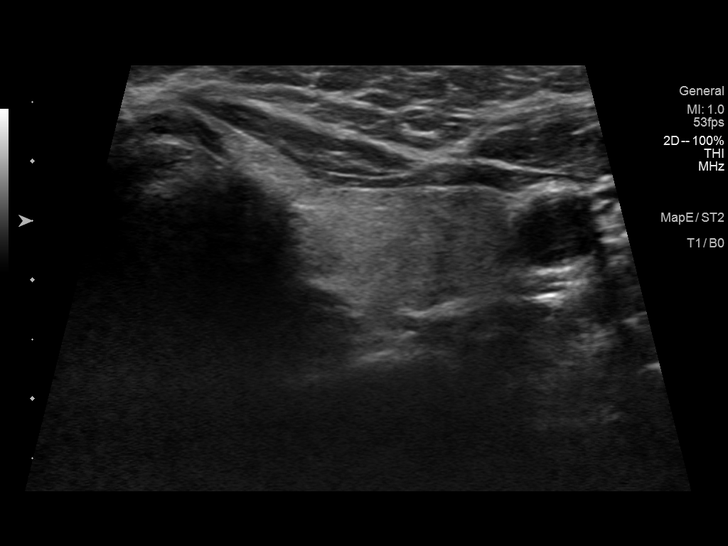
[im 31/34]
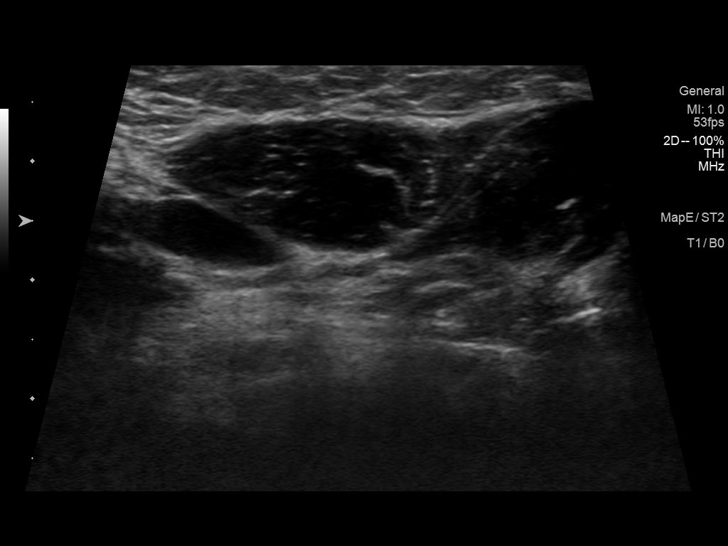
[im 34/34]
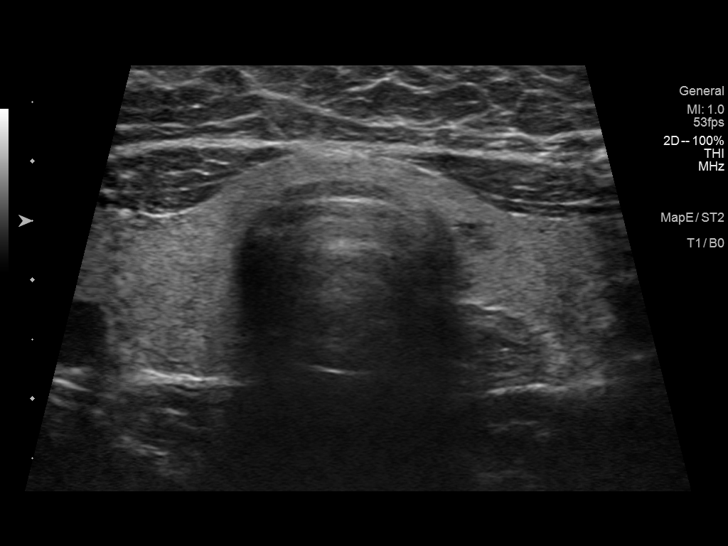

[14 of 25 positions shown; findings below may reference images not displayed]

FINDINGS: Parenchymal Echotexture: Normal

Isthmus: 0.3 cm

Right lobe: 4.4 x 1.2 x 1.9 cm

Left lobe: 4.1 x 1.1 x 1.6 cm

_________________________________________________________

Estimated total number of nodules >/= 1 cm: 0

Number of spongiform nodules >/=  2 cm not described below (TR1): 0

Number of mixed cystic and solid nodules >/= 1.5 cm not described
below (TR2): 0

_________________________________________________________

No discrete nodules are seen within the thyroid gland.
IMPRESSION: Normal thyroid ultrasound.

The above is in keeping with the ACR TI-RADS recommendations - [HOSPITAL] 8321;[DATE].

## 2022-05-12 ENCOUNTER — Encounter: Payer: Self-pay | Admitting: Internal Medicine

## 2022-06-14 ENCOUNTER — Encounter: Payer: Self-pay | Admitting: Internal Medicine

## 2022-07-05 ENCOUNTER — Other Ambulatory Visit (HOSPITAL_BASED_OUTPATIENT_CLINIC_OR_DEPARTMENT_OTHER): Payer: Self-pay

## 2022-07-05 MED ORDER — FLUARIX QUADRIVALENT 0.5 ML IM SUSY
PREFILLED_SYRINGE | INTRAMUSCULAR | 0 refills | Status: DC
  Filled 2022-07-05: qty 0.5, 1d supply, fill #0

## 2022-07-30 ENCOUNTER — Other Ambulatory Visit: Payer: Self-pay | Admitting: Internal Medicine

## 2022-08-03 ENCOUNTER — Other Ambulatory Visit (HOSPITAL_BASED_OUTPATIENT_CLINIC_OR_DEPARTMENT_OTHER): Payer: Self-pay

## 2022-08-03 MED ORDER — COMIRNATY 30 MCG/0.3ML IM SUSY
PREFILLED_SYRINGE | INTRAMUSCULAR | 0 refills | Status: DC
  Filled 2022-08-03: qty 0.3, 1d supply, fill #0

## 2022-08-05 ENCOUNTER — Encounter: Payer: Self-pay | Admitting: Internal Medicine

## 2022-08-25 ENCOUNTER — Other Ambulatory Visit: Payer: Self-pay | Admitting: Internal Medicine

## 2022-08-30 ENCOUNTER — Other Ambulatory Visit: Payer: Self-pay | Admitting: Internal Medicine

## 2022-08-30 DIAGNOSIS — Z1231 Encounter for screening mammogram for malignant neoplasm of breast: Secondary | ICD-10-CM

## 2022-09-28 ENCOUNTER — Ambulatory Visit (INDEPENDENT_AMBULATORY_CARE_PROVIDER_SITE_OTHER): Payer: Self-pay | Admitting: Internal Medicine

## 2022-09-28 ENCOUNTER — Encounter: Payer: Self-pay | Admitting: Internal Medicine

## 2022-09-28 VITALS — BP 120/82 | HR 97 | Temp 98.7°F | Ht 66.6 in | Wt 224.0 lb

## 2022-09-28 DIAGNOSIS — R7303 Prediabetes: Secondary | ICD-10-CM

## 2022-09-28 DIAGNOSIS — F3341 Major depressive disorder, recurrent, in partial remission: Secondary | ICD-10-CM

## 2022-09-28 DIAGNOSIS — F3289 Other specified depressive episodes: Secondary | ICD-10-CM

## 2022-09-28 DIAGNOSIS — Z1211 Encounter for screening for malignant neoplasm of colon: Secondary | ICD-10-CM

## 2022-09-28 DIAGNOSIS — Z6835 Body mass index (BMI) 35.0-35.9, adult: Secondary | ICD-10-CM

## 2022-09-28 DIAGNOSIS — R051 Acute cough: Secondary | ICD-10-CM

## 2022-09-28 MED ORDER — DULOXETINE HCL 60 MG PO CPEP: ORAL_CAPSULE | ORAL | 0 refills | Status: AC

## 2022-09-28 NOTE — Patient Instructions (Signed)

## 2022-09-28 NOTE — Progress Notes (Signed)
Rich Brave Llittleton,acting as a Education administrator for Maximino Greenland, MD.,have documented all relevant documentation on the behalf of Maximino Greenland, MD,as directed by  Maximino Greenland, MD while in the presence of Maximino Greenland, MD.    Subjective:     Patient ID: Kelly Charles , female    DOB: December 22, 1975 , 46 y.o.   MRN: [id number]   Chief Complaint  Patient presents with   Prediabetes   Depression    HPI  Patient presents today for a f/u on her prediabetes and depression. She reports compliance with her meds.  She is also concerned about her cough. Started 2 days ago. Denies fever/chills. Denies ill contacts. Also with sinus congestion.   Depression        This is a chronic problem.  The current episode started more than 1 year ago.   The onset quality is gradual.      The symptoms are aggravated by family issues and work stress.  Compliance with treatment is good.  Previous treatment provided moderate relief.    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 No abnormal labs, cxray, or EKG (sinus tach). Pain was determined not to be emergent so patient left after 8 hours.She followed up with PCP on 08/12/21. No chest pain or SOB since as of 09/21/21. Pt stated that she was told the problem may be related to her thyroid. She has an endocrinology appt on 10/07/21 per pt.   Hyperthyroidism 2009   radioactive iodine   Trigeminal neuralgia      Family History  Problem Relation Age of Onset   Cancer Maternal Grandfather        BLADDER   Cancer Father        PROSTATE   Diabetes Father    Diabetes Mother    Mental illness Mother        ANXIETY   Aneurysm Mother        Small cerebral aneurysm   Thyroid disease Maternal Aunt    Cancer Maternal Aunt        LUNG   Hypertension Maternal Uncle     Diabetes Maternal Uncle    Hypertension Maternal Aunt    Diabetes Maternal Aunt    Mental illness Maternal Aunt    Breast cancer Maternal Aunt      Current Outpatient Medications:    atenolol (TENORMIN) 25 MG tablet, TAKE 1 TABLET (25 MG TOTAL) BY MOUTH DAILY., Disp: 90 tablet, Rfl: 3   buPROPion (WELLBUTRIN XL) 300 MG 24 hr tablet, , Disp: , Rfl:    busPIRone (BUSPAR) 10 MG tablet, Take 1 tablet by mouth 2 (two) times daily., Disp: , Rfl:    metFORMIN (GLUCOPHAGE) 1000 MG tablet, TAKE 1 TABLET BY MOUTH EVERY DAY WITH BREAKFAST, Disp: 90 tablet, Rfl: 1   Vitamin D, Ergocalciferol, (DRISDOL) 1.25 MG (50000 UNIT) CAPS capsule, TAKE 1 CAPSULE (50,000 UNITS TOTAL) BY MOUTH EVERY 7 (SEVEN) DAYS, Disp: 13 capsule, Rfl: 3   DULoxetine (CYMBALTA) 60 MG capsule, One tab po bid, Disp: 180 capsule, Rfl: 0   Allergies  Allergen Reactions   Levaquin [Levofloxacin] Anaphylaxis    Hives, dyspepsia, shortness of breath   Penicillins Rash     Review of Systems  Constitutional: Negative.   Respiratory: Negative.    Cardiovascular: Negative.   Gastrointestinal: Negative.   Neurological: Negative.   Psychiatric/Behavioral:  Positive for depression.      Today's Vitals   09/28/22 1408  BP: 120/82  Pulse: 97  Temp: 98.7 F (37.1 C)  Weight: 224 lb (101.6 kg)  Height: 5' 6.6" (1.692 m)  PainSc: 0-No pain   Body mass index is 35.51 kg/m.  Wt Readings from Last 3 Encounters:  09/28/22 224 lb (101.6 kg)  03/23/22 226 lb 12.8 oz (102.9 kg)  03/21/22 226 lb (102.5 kg)     Objective:  Physical Exam Vitals and nursing note reviewed.  Constitutional:      Appearance: Normal appearance.  HENT:     Head: Normocephalic and atraumatic.     Nose:     Comments: Masked    Mouth/Throat:     Comments: Masked  Eyes:     Extraocular Movements: Extraocular movements intact.  Cardiovascular:     Rate and Rhythm: Normal rate and regular rhythm.     Heart sounds: Normal heart sounds.  Pulmonary:      Effort: Pulmonary effort is normal.     Breath sounds: Normal breath sounds.  Musculoskeletal:     Cervical back: Normal range of motion.  Skin:    General: Skin is warm.  Neurological:     General: No focal deficit present.     Mental Status: She is alert.  Psychiatric:        Mood and Affect: Mood normal.        Behavior: Behavior normal.      Assessment And Plan:     1. Prediabetes Comments: I will recheck labs as below. Importance of regular exercise was again d/w pt. She is encouraged to decrease intake of sugary beverages/foods. F/u 4 months. - CMP14+EGFR - Hemoglobin A1c - Amb Referral To Provider Referral Exercise Program (P.R.E.P)  2. Recurrent major depressive disorder, in partial remission ([place]) Comments: Chronic, she is followed by [facility]. Meds reviewed, she feels her sx are controlled.  3. Acute cough Comments: She agrees to PCR COVID testing. - Novel Coronavirus, NAA ([facility])  4. Class 2 severe obesity due to excess calories with serious comorbidity and body mass index (BMI) of 35.0 to 35.9 in adult [facility]) Comments: She agrees to referral to the PREP program. - Amb Referral To Provider Referral Exercise Program (P.R.E.P)  5. Screen for colon cancer Comments: She agrees to GI referral for CRC screening. - Ambulatory referral to Gastroenterology   Patient was given opportunity to ask questions. Patient verbalized understanding of the plan and was able to repeat key elements of the plan. All questions were answered to their satisfaction.   I, Maximino Greenland, MD, have reviewed all documentation for this visit. The documentation on 09/28/22 for the exam, diagnosis, procedures, and orders are all accurate and complete.   IF YOU HAVE BEEN REFERRED TO A SPECIALIST, IT MAY TAKE 1-2 WEEKS TO SCHEDULE/PROCESS THE REFERRAL. IF YOU HAVE NOT HEARD FROM US/SPECIALIST IN TWO WEEKS, PLEASE GIVE [place] A CALL AT [phone] X [phone].   THE PATIENT IS ENCOURAGED TO PRACTICE SOCIAL  DISTANCING DUE TO THE COVID-19 PANDEMIC.

## 2022-09-29 ENCOUNTER — Encounter: Payer: Self-pay | Admitting: Internal Medicine

## 2022-09-29 LAB — CMP14+EGFR
ALT: 17 IU/L (ref 0–32)
AST: 15 IU/L (ref 0–40)
Albumin/Globulin Ratio: 1.3 (ref 1.2–2.2)
Albumin: 4.2 g/dL (ref 3.9–4.9)
Alkaline Phosphatase: 61 IU/L (ref 44–121)
BUN/Creatinine Ratio: 15 (ref 9–23)
BUN: 12 mg/dL (ref 6–24)
Bilirubin Total: <0.2 mg/dL (ref 0.0–1.2)
CO2: 19 mmol/L — ABNORMAL LOW (ref 20–29)
Calcium: 9.6 mg/dL (ref 8.7–10.2)
Chloride: 103 mmol/L (ref 96–106)
Creatinine, Ser: 0.82 mg/dL (ref 0.57–1.00)
Globulin, Total: 3.2 g/dL (ref 1.5–4.5)
Glucose: 68 mg/dL — ABNORMAL LOW (ref 70–99)
Potassium: 4.6 mmol/L (ref 3.5–5.2)
Sodium: 138 mmol/L (ref 134–144)
Total Protein: 7.4 g/dL (ref 6.0–8.5)
eGFR: 90 mL/min/{1.73_m2} (ref >59)

## 2022-09-29 LAB — NOVEL CORONAVIRUS, NAA: SARS-CoV-2, NAA: DETECTED — AB

## 2022-09-29 LAB — HEMOGLOBIN A1C
Est. average glucose Bld gHb Est-mCnc: 134 mg/dL
Hgb A1c MFr Bld: 6.3 % — ABNORMAL HIGH (ref 4.8–5.6)

## 2022-09-30 ENCOUNTER — Telehealth: Payer: Self-pay

## 2022-09-30 NOTE — Telephone Encounter (Signed)
LVMT pt requesting call back reference PREP referral.  

## 2022-10-28 ENCOUNTER — Ambulatory Visit
Admission: RE | Admit: 2022-10-28 | Discharge: 2022-10-28 | Disposition: A | Discharge status: Home / Self Care | Payer: Commercial Managed Care - HMO | Source: Ambulatory Visit | Attending: Internal Medicine | Admitting: Internal Medicine

## 2022-10-28 DIAGNOSIS — Z1231 Encounter for screening mammogram for malignant neoplasm of breast: Secondary | ICD-10-CM

## 2022-11-25 ENCOUNTER — Other Ambulatory Visit: Payer: Self-pay | Admitting: Internal Medicine

## 2023-03-21 ENCOUNTER — Other Ambulatory Visit: Payer: Self-pay | Admitting: Internal Medicine

## 2023-03-30 ENCOUNTER — Ambulatory Visit: Payer: Commercial Managed Care - HMO | Admitting: Internal Medicine

## 2023-03-30 ENCOUNTER — Encounter: Payer: Self-pay | Admitting: Internal Medicine

## 2023-03-30 VITALS — BP 120/82 | Temp 98.1°F | Ht 66.0 in | Wt 225.8 lb

## 2023-03-30 DIAGNOSIS — Z809 Family history of malignant neoplasm, unspecified: Secondary | ICD-10-CM

## 2023-03-30 DIAGNOSIS — Z1211 Encounter for screening for malignant neoplasm of colon: Secondary | ICD-10-CM

## 2023-03-30 DIAGNOSIS — R7303 Prediabetes: Secondary | ICD-10-CM | POA: Diagnosis not present

## 2023-03-30 DIAGNOSIS — Z0000 Encounter for general adult medical examination without abnormal findings: Secondary | ICD-10-CM

## 2023-03-30 DIAGNOSIS — M6281 Muscle weakness (generalized): Secondary | ICD-10-CM | POA: Diagnosis not present

## 2023-03-30 DIAGNOSIS — E0590 Thyrotoxicosis, unspecified without thyrotoxic crisis or storm: Secondary | ICD-10-CM | POA: Diagnosis not present

## 2023-03-30 DIAGNOSIS — F3341 Major depressive disorder, recurrent, in partial remission: Secondary | ICD-10-CM | POA: Diagnosis not present

## 2023-03-30 DIAGNOSIS — Z6836 Body mass index (BMI) 36.0-36.9, adult: Secondary | ICD-10-CM

## 2023-03-30 NOTE — Progress Notes (Signed)
Subjective:    Patient ID: Kelly Charles , female    DOB: [birth year] , 47 y.o.   MRN: [id number]  Chief Complaint  Patient presents with   Annual Exam   Prediabetes    HPI  She is here today for a full physical examination. She is not followed by GYN. She has no specific concerns or complaints at this time. She would like to have a pap smear performed today.  Patient has an concern about Metformin. She has heard the medication is not good for long term use. She would like an alternative medication.       Past Medical History:  Diagnosis Date   Anxiety    Carpal tunnel syndrome on both sides    Depression    Diabetes mellitus without complication (HCC)    DM2   Hx of chest pain 08/11/2020   Pt was taken via EMS to ER. No abnormal labs, cxray, or EKG (sinus tach). Pain was determined not to be emergent so patient left after 8 hours.She followed up with PCP on 08/12/21. No chest pain or SOB since as of 09/21/21. Pt stated that she was told the problem may be related to her thyroid. She has an endocrinology appt on 10/07/21 per pt.   Hyperthyroidism 2009   radioactive iodine   Trigeminal neuralgia      Family History  Problem Relation Age of Onset   Cancer Maternal Grandfather        BLADDER   Cancer Father        PROSTATE   Diabetes Father    Diabetes Mother    Mental illness Mother        ANXIETY   Aneurysm Mother        Small cerebral aneurysm   Thyroid disease Maternal Aunt    Cancer Maternal Aunt        LUNG   Hypertension Maternal Uncle    Diabetes Maternal Uncle    Hypertension Maternal Aunt    Diabetes Maternal Aunt    Mental illness Maternal Aunt    Breast cancer Maternal Aunt      Current Outpatient Medications:    atenolol (TENORMIN) 25 MG tablet, TAKE 1 TABLET (25 MG TOTAL) BY MOUTH DAILY., Disp: 90 tablet, Rfl: 3   buPROPion (WELLBUTRIN XL) 300 MG 24 hr tablet, , Disp: , Rfl:    busPIRone (BUSPAR) 15 MG tablet, Take 15 mg by mouth 3  (three) times daily., Disp: , Rfl:    Dulaglutide (TRULICITY) 0.75 MG/0.5ML SOPN, Inject 0.75 mg into the skin once a week., Disp: 2 mL, Rfl: 1   DULoxetine (CYMBALTA) 60 MG capsule, One tab po bid, Disp: 180 capsule, Rfl: 0   metFORMIN (GLUCOPHAGE) 1000 MG tablet, TAKE 1 TABLET BY MOUTH EVERY DAY WITH BREAKFAST, Disp: 90 tablet, Rfl: 1   Vitamin D, Ergocalciferol, (DRISDOL) 1.25 MG (50000 UNIT) CAPS capsule, TAKE 1 CAPSULE (50,000 UNITS TOTAL) BY MOUTH EVERY 7 (SEVEN) DAYS, Disp: 13 capsule, Rfl: 3   tirzepatide (MOUNJARO) 2.5 MG/0.5ML Pen, Inject 2.5 mg into the skin once a week., Disp: 2 mL, Rfl: 0   Allergies  Allergen Reactions   Levaquin [Levofloxacin] Anaphylaxis    Hives, dyspepsia, shortness of breath   Penicillins Rash      The patient states she uses none for birth control. No LMP recorded.. Negative for Dysmenorrhea. Negative for: breast discharge, breast lump(s), breast pain and breast self exam. Associated symptoms include abnormal vaginal bleeding. Pertinent negatives include  abnormal bleeding (hematology), anxiety, decreased libido, depression, difficulty falling sleep, dyspareunia, history of infertility, nocturia, sexual dysfunction, sleep disturbances, urinary incontinence, urinary urgency, vaginal discharge and vaginal itching. Diet regular.The patient states her exercise level is  intermittent.  . The patient's tobacco use is:  Social History   Tobacco Use  Smoking Status Never  Smokeless Tobacco Never  . She has been exposed to passive smoke. The patient's alcohol use is:  Social History   Substance and Sexual Activity  Alcohol Use No    Review of Systems  Constitutional: Negative.   HENT: Negative.    Eyes: Negative.   Respiratory: Negative.    Cardiovascular: Negative.   Gastrointestinal: Negative.   Endocrine: Negative.   Genitourinary: Negative.   Musculoskeletal: Negative.   Skin: Negative.   Allergic/Immunologic: Negative.   Neurological: Negative.    Hematological: Negative.   Psychiatric/Behavioral: Negative.       Today's Vitals   03/30/23 1000  BP: 120/82  Temp: 98.1 F (36.7 C)  SpO2: 98%  Weight: 225 lb 12.8 oz (102.4 kg)  Height: 5\' 6"  (1.676 m)   Body mass index is 36.45 kg/m.  Wt Readings from Last 3 Encounters:  03/30/23 225 lb 12.8 oz (102.4 kg)  09/28/22 224 lb (101.6 kg)  03/23/22 226 lb 12.8 oz (102.9 kg)    BP Readings from Last 3 Encounters:  03/30/23 120/82  09/28/22 120/82  03/23/22 124/80    Objective:  Physical Exam Vitals and nursing note reviewed.  Constitutional:      Appearance: Normal appearance.  HENT:     Head: Normocephalic and atraumatic.     Right Ear: Tympanic membrane, ear canal and external ear normal.     Left Ear: Tympanic membrane, ear canal and external ear normal.     Nose: Nose normal.     Mouth/Throat:     Mouth: Mucous membranes are moist.     Pharynx: Oropharynx is clear.  Eyes:     Extraocular Movements: Extraocular movements intact.     Conjunctiva/sclera: Conjunctivae normal.     Pupils: Pupils are equal, round, and reactive to light.  Cardiovascular:     Rate and Rhythm: Normal rate and regular rhythm.     Pulses: Normal pulses.     Heart sounds: Normal heart sounds.  Pulmonary:     Effort: Pulmonary effort is normal.     Breath sounds: Normal breath sounds.  Chest:  Breasts:    Tanner Score is 5.     Right: Normal.     Left: Normal.  Abdominal:     General: Bowel sounds are normal.     Palpations: Abdomen is soft.  Genitourinary:    Comments: deferred Musculoskeletal:        General: Normal range of motion.     Cervical back: Normal range of motion and neck supple.  Skin:    General: Skin is warm and dry.  Neurological:     General: No focal deficit present.     Mental Status: She is alert and oriented to person, place, and time.     Motor: No weakness.     Coordination: Coordination normal.  Psychiatric:        Mood and Affect: Mood normal.         Behavior: Behavior normal.         Assessment And Plan:     1. Encounter for general adult medical examination w/o abnormal findings Comments: A full exam was performed. Importance of monthly  self breast exams was discussed with the patient. PATIENT IS ADVISED TO GET 30-45 MINUTES REGULAR EXERCISE NO LESS THAN FOUR TO FIVE DAYS PER WEEK - BOTH WEIGHTBEARING EXERCISES AND AEROBIC ARE RECOMMENDED.  PATIENT IS ADVISED TO FOLLOW A HEALTHY DIET WITH AT LEAST SIX FRUITS/VEGGIES PER DAY, DECREASE INTAKE OF RED MEAT, AND TO INCREASE FISH INTAKE TO TWO DAYS PER WEEK.  MEATS/FISH SHOULD NOT BE FRIED, BAKED OR BROILED IS PREFERABLE.  IT IS ALSO IMPORTANT TO CUT BACK ON YOUR SUGAR INTAKE. PLEASE AVOID ANYTHING WITH ADDED SUGAR, CORN SYRUP OR OTHER SWEETENERS. IF YOU MUST USE A SWEETENER, YOU CAN TRY STEVIA. IT IS ALSO IMPORTANT TO AVOID ARTIFICIALLY SWEETENERS AND DIET BEVERAGES. LASTLY, I SUGGEST WEARING SPF 50 SUNSCREEN ON EXPOSED PARTS AND ESPECIALLY WHEN IN THE DIRECT SUNLIGHT FOR AN EXTENDED PERIOD OF TIME.  PLEASE AVOID FAST FOOD RESTAURANTS AND INCREASE YOUR WATER INTAKE. - CBC - CMP14+EGFR - Lipid panel - Hemoglobin A1c  2. Prediabetes Comments: Previous labs reviewed, her a1c has been elevated in the past. I will recheck an a1c today. Advised to limit her intake of sugary foods/beverages.  3. Recurrent major depressive disorder, in partial remission (HCC) Comments: Sx are stable on duloxetine and bupropion daily. Importance of medication compliance was discussed with the patient.  4. Muscle weakness of left upper extremity Comments: No deficit noted on exam. Will consider EMG/NCS if sx persist. May also need MRI brain.  5. Subclinical hyperthyroidism Comments: I will check labs as below. If needed, I will refer her to Endo for further evaluation. - TSH + free T4  6. Class 2 severe obesity due to excess calories with serious comorbidity and body mass index (BMI) of 36.0 to 36.9 in adult  [facility]) She is encouraged to aim for at least 150 minutes of exercise/week, while striving for BMI<30 to decrease cardiac risk.   7. Screen for colon cancer - Ambulatory referral to Gastroenterology  8. Family history of cancer Comments: Mom w/ renal cell CA, mat aunt w/ lung, mat aunt w/ breast, mat uncle w/ prostate ca. Father had prostate CA and mat GF had bladder CA. She agrees to genetic - Ambulatory referral to Genetics    Return in about 6 months (around 09/29/2023), or prediabetes, for 1 year physical. Patient was given opportunity to ask questions. Patient verbalized understanding of the plan and was able to repeat key elements of the plan. All questions were answered to their satisfaction.    I, Gwynneth Aliment, MD, have reviewed all documentation for this visit. The documentation on 03/30/23 for the exam, diagnosis, procedures, and orders are all accurate and complete.

## 2023-03-30 NOTE — Patient Instructions (Signed)

## 2023-03-31 ENCOUNTER — Encounter: Payer: Self-pay | Admitting: Internal Medicine

## 2023-03-31 LAB — TSH+FREE T4
Free T4: 1.25 ng/dL (ref 0.82–1.77)
TSH: 0.434 u[IU]/mL — ABNORMAL LOW (ref 0.450–4.500)

## 2023-03-31 LAB — CBC
Hematocrit: 36.4 % (ref 34.0–46.6)
Hemoglobin: 11.1 g/dL (ref 11.1–15.9)
MCH: 22.3 pg — ABNORMAL LOW (ref 26.6–33.0)
MCHC: 30.5 g/dL — ABNORMAL LOW (ref 31.5–35.7)
MCV: 73 fL — ABNORMAL LOW (ref 79–97)
Platelets: 439 10*3/uL (ref 150–450)
RBC: 4.98 x10E6/uL (ref 3.77–5.28)
RDW: 16.4 % — ABNORMAL HIGH (ref 11.7–15.4)
WBC: 4.8 10*3/uL (ref 3.4–10.8)

## 2023-03-31 LAB — CMP14+EGFR
ALT: 8 IU/L (ref 0–32)
AST: 11 IU/L (ref 0–40)
Albumin/Globulin Ratio: 1.6
Albumin: 4.2 g/dL (ref 3.9–4.9)
Alkaline Phosphatase: 60 IU/L (ref 44–121)
BUN/Creatinine Ratio: 9 (ref 9–23)
BUN: 8 mg/dL (ref 6–24)
Bilirubin Total: 0.5 mg/dL (ref 0.0–1.2)
CO2: 23 mmol/L (ref 20–29)
Calcium: 9.5 mg/dL (ref 8.7–10.2)
Chloride: 103 mmol/L (ref 96–106)
Creatinine, Ser: 0.86 mg/dL (ref 0.57–1.00)
Globulin, Total: 2.7 g/dL (ref 1.5–4.5)
Glucose: 82 mg/dL (ref 70–99)
Potassium: 4.8 mmol/L (ref 3.5–5.2)
Sodium: 138 mmol/L (ref 134–144)
Total Protein: 6.9 g/dL (ref 6.0–8.5)
eGFR: 85 mL/min/{1.73_m2} (ref >59)

## 2023-03-31 LAB — LIPID PANEL
Chol/HDL Ratio: 2.9 ratio (ref 0.0–4.4)
Cholesterol, Total: 159 mg/dL (ref 100–199)
HDL: 54 mg/dL (ref >39)
LDL Chol Calc (NIH): 89 mg/dL (ref 0–99)
Triglycerides: 85 mg/dL (ref 0–149)
VLDL Cholesterol Cal: 16 mg/dL (ref 5–40)

## 2023-03-31 LAB — HEMOGLOBIN A1C
Est. average glucose Bld gHb Est-mCnc: 140 mg/dL
Hgb A1c MFr Bld: 6.5 % — ABNORMAL HIGH (ref 4.8–5.6)

## 2023-04-03 ENCOUNTER — Other Ambulatory Visit: Payer: Self-pay

## 2023-04-03 MED ORDER — TRULICITY 0.75 MG/0.5ML ~~LOC~~ SOAJ: 0.7500 mg | SUBCUTANEOUS | 1 refills | Status: DC

## 2023-04-03 MED ORDER — MOUNJARO 2.5 MG/0.5ML ~~LOC~~ SOAJ: 2.5000 mg | SUBCUTANEOUS | 0 refills | Status: DC

## 2023-04-10 ENCOUNTER — Telehealth: Payer: Self-pay | Admitting: Internal Medicine

## 2023-04-10 NOTE — Telephone Encounter (Signed)
Left a message regarding information for [employer].

## 2023-04-14 ENCOUNTER — Encounter: Payer: Self-pay | Admitting: Internal Medicine

## 2023-04-14 ENCOUNTER — Telehealth: Payer: Self-pay

## 2023-05-09 LAB — HM COLONOSCOPY

## 2023-05-12 NOTE — Telephone Encounter (Signed)
Error

## 2023-07-06 ENCOUNTER — Telehealth: Payer: Commercial Managed Care - HMO | Admitting: Physician Assistant

## 2023-07-06 DIAGNOSIS — R197 Diarrhea, unspecified: Secondary | ICD-10-CM

## 2023-07-06 DIAGNOSIS — R42 Dizziness and giddiness: Secondary | ICD-10-CM

## 2023-07-06 NOTE — Progress Notes (Signed)
Because of the dizziness which is a sign of dehydration and warrants examination and further assessment, I feel your condition warrants further evaluation and I recommend that you be seen in person for a face to face visit.   NOTE: There will be NO CHARGE for this eVisit   If you are having a true medical emergency please call 911.      For an urgent face to face visit, [place] has eight urgent care centers for your convenience:   NEW!! [facility] Health Urgent Care Center at [facility] Get Driving Directions [phone] [address], [address] [place], [address]    [facility] Health Urgent Care Center at [facility] Get Driving Directions [phone] [address] [place], [place] [address]   [facility] Urgent Care Center [facility]) Get Driving Directions [phone] [address] [place], [place] [address]  [facility] Health Urgent Care Center [facility]) Get Driving Directions [phone] [address] [place],  [place]  [address]  [facility] Health Urgent Care Center [facility] - at [employer]  [phone] [address] [place],  [place] [address]   [facility] Health Urgent Care at [facility] Get Driving Directions [phone] [address] [place], [place] [address]   [facility] Health Urgent Care at [facility] Get Driving Directions  [phone] [address] [place], [place] [address]   [facility] Health Urgent Care at [facility] Directions [phone] [address] [place], [place] [address]  Your MyChart E-visit questionnaire answers were reviewed by a board certified advanced clinical practitioner to complete your personal care plan based on your specific symptoms.  Thank you for using e-Visits.

## 2023-07-07 ENCOUNTER — Telehealth: Payer: Commercial Managed Care - HMO | Admitting: Emergency Medicine

## 2023-07-07 DIAGNOSIS — R197 Diarrhea, unspecified: Secondary | ICD-10-CM | POA: Diagnosis not present

## 2023-07-07 NOTE — Progress Notes (Signed)
E-Visit for Diarrhea  We are sorry that you are not feeling well.  Here is how we plan to help!  Based on what you have shared with me it looks like you have Acute Infectious Diarrhea.  Most cases of acute diarrhea are due to infections with virus and bacteria and are self-limited conditions lasting less than 14 days.  If your symptoms continue for more than 2 weeks, you should be checked in person as you may need stool testing  For your symptoms you may take Imodium 2 mg tablets that are over the counter at your local pharmacy. Take two tablet now and then one after each loose stool up to 6 a day.   Antibiotics are not needed for most people with diarrhea.   HOME CARE We recommend changing your diet to help with your symptoms for the next few days. Drink plenty of fluids that contain water salt and sugar. Sports drinks such as Gatorade may help.  You may try broths, soups, bananas, applesauce, soft breads, mashed potatoes or crackers.  You are considered infectious for as long as the diarrhea continues. Hand washing or use of alcohol based hand sanitizers is recommend. It is best to stay out of work or school until your symptoms stop.   GET HELP RIGHT AWAY If you have dark yellow colored urine or do not pass urine frequently you should drink more fluids.   If your symptoms worsen  If you feel like you are going to pass out (faint) You have a new problem  MAKE SURE YOU  Understand these instructions. Will watch your condition. Will get help right away if you are not doing well or get worse.  Thank you for choosing an e-visit.  Your e-visit answers were reviewed by a board certified advanced clinical practitioner to complete your personal care plan. Depending upon the condition, your plan could have included both over the counter or prescription medications.  Please review your pharmacy choice. Make sure the pharmacy is open so you can pick up prescription now. If there is a  problem, you may contact your provider through [employer] and have the prescription routed to another pharmacy.  Your safety is important to [place]. If you have drug allergies check your prescription carefully.   For the next 24 hours you can use MyChart to ask questions about today's visit, request a non-urgent call back, or ask for a work or school excuse. You will get an email in the next two days asking about your experience. I hope that your e-visit has been valuable and will speed your recovery.   I have spent 5 minutes in review of e-visit questionnaire, review and updating patient chart, medical decision making and response to patient.   Rica Mast, PhD, FNP-BC

## 2023-07-12 ENCOUNTER — Ambulatory Visit: Payer: Commercial Managed Care - HMO | Admitting: Nurse Practitioner

## 2023-07-12 ENCOUNTER — Encounter: Payer: Self-pay | Admitting: Nurse Practitioner

## 2023-07-12 VITALS — BP 120/78 | HR 92 | Temp 98.5°F | Ht 66.0 in | Wt 216.0 lb

## 2023-07-12 DIAGNOSIS — Z23 Encounter for immunization: Secondary | ICD-10-CM | POA: Diagnosis not present

## 2023-07-12 DIAGNOSIS — R110 Nausea: Secondary | ICD-10-CM

## 2023-07-12 DIAGNOSIS — K5792 Diverticulitis of intestine, part unspecified, without perforation or abscess without bleeding: Secondary | ICD-10-CM

## 2023-07-12 MED ORDER — SULFAMETHOXAZOLE-TRIMETHOPRIM 800-160 MG PO TABS: 1.0000 | ORAL_TABLET | Freq: Two times a day (BID) | ORAL | 0 refills | Status: DC

## 2023-07-12 MED ORDER — METRONIDAZOLE 500 MG PO TABS: 500.0000 mg | ORAL_TABLET | Freq: Three times a day (TID) | ORAL | 0 refills | Status: AC

## 2023-07-12 MED ORDER — ONDANSETRON HCL 4 MG PO TABS: 4.0000 mg | ORAL_TABLET | Freq: Every day | ORAL | 1 refills | Status: DC | PRN

## 2023-07-12 MED ORDER — TRAMADOL HCL 50 MG PO TABS: 50.0000 mg | ORAL_TABLET | Freq: Four times a day (QID) | ORAL | 0 refills | Status: DC | PRN

## 2023-07-13 LAB — CBC WITH DIFFERENTIAL/PLATELET
Basophils Absolute: 0 10*3/uL (ref 0.0–0.2)
Basos: 1 %
EOS (ABSOLUTE): 0.1 10*3/uL (ref 0.0–0.4)
Eos: 1 %
Hematocrit: 37.7 % (ref 34.0–46.6)
Hemoglobin: 11.1 g/dL (ref 11.1–15.9)
Immature Grans (Abs): 0 10*3/uL (ref 0.0–0.1)
Immature Granulocytes: 1 %
Lymphocytes Absolute: 1.6 10*3/uL (ref 0.7–3.1)
Lymphs: 39 %
MCH: 21.8 pg — ABNORMAL LOW (ref 26.6–33.0)
MCHC: 29.4 g/dL — ABNORMAL LOW (ref 31.5–35.7)
MCV: 74 fL — ABNORMAL LOW (ref 79–97)
Monocytes Absolute: 0.6 10*3/uL (ref 0.1–0.9)
Monocytes: 15 %
Neutrophils Absolute: 1.8 10*3/uL (ref 1.4–7.0)
Neutrophils: 43 %
Platelets: 423 10*3/uL (ref 150–450)
RBC: 5.1 x10E6/uL (ref 3.77–5.28)
RDW: 15.4 % (ref 11.7–15.4)
WBC: 4.2 10*3/uL (ref 3.4–10.8)

## 2023-07-24 ENCOUNTER — Telehealth: Payer: Commercial Managed Care - HMO | Admitting: Nurse Practitioner

## 2023-08-01 ENCOUNTER — Encounter: Payer: Self-pay | Admitting: Nurse Practitioner

## 2023-08-01 DIAGNOSIS — R110 Nausea: Secondary | ICD-10-CM | POA: Insufficient documentation

## 2023-08-01 DIAGNOSIS — Z23 Encounter for immunization: Secondary | ICD-10-CM | POA: Insufficient documentation

## 2023-08-01 DIAGNOSIS — K5792 Diverticulitis of intestine, part unspecified, without perforation or abscess without bleeding: Secondary | ICD-10-CM | POA: Insufficient documentation

## 2023-08-01 NOTE — Assessment & Plan Note (Signed)
Will treat with an antinausea medication.

## 2023-08-01 NOTE — Assessment & Plan Note (Signed)
This is a new diagnosis in July, continues to have pain. Will treat today with an antibiotic and advised to avoid eating foods with seeds. She is to follow up with Dr. Elnoria Howard

## 2023-08-01 NOTE — Assessment & Plan Note (Signed)
Influenza vaccine administered Encouraged to take Tylenol as needed for fever or muscle aches.

## 2023-09-17 ENCOUNTER — Other Ambulatory Visit: Payer: Self-pay | Admitting: Internal Medicine

## 2023-10-02 ENCOUNTER — Ambulatory Visit: Payer: Commercial Managed Care - HMO | Admitting: Internal Medicine

## 2023-10-10 ENCOUNTER — Ambulatory Visit (INDEPENDENT_AMBULATORY_CARE_PROVIDER_SITE_OTHER): Payer: Self-pay | Admitting: Internal Medicine

## 2023-10-10 ENCOUNTER — Encounter: Payer: Self-pay | Admitting: Internal Medicine

## 2023-10-10 VITALS — BP 110/70 | HR 75 | Temp 98.5°F | Ht 66.0 in | Wt 221.2 lb

## 2023-10-10 DIAGNOSIS — E559 Vitamin D deficiency, unspecified: Secondary | ICD-10-CM | POA: Diagnosis not present

## 2023-10-10 DIAGNOSIS — E0590 Thyrotoxicosis, unspecified without thyrotoxic crisis or storm: Secondary | ICD-10-CM | POA: Diagnosis not present

## 2023-10-10 DIAGNOSIS — Z630 Problems in relationship with spouse or partner: Secondary | ICD-10-CM

## 2023-10-10 DIAGNOSIS — Z2821 Immunization not carried out because of patient refusal: Secondary | ICD-10-CM

## 2023-10-10 DIAGNOSIS — M25551 Pain in right hip: Secondary | ICD-10-CM | POA: Diagnosis not present

## 2023-10-10 DIAGNOSIS — R7303 Prediabetes: Secondary | ICD-10-CM

## 2023-10-10 MED ORDER — ATENOLOL 25 MG PO TABS: 25.0000 mg | ORAL_TABLET | Freq: Every day | ORAL | 3 refills | Status: DC

## 2023-10-10 NOTE — Progress Notes (Unsigned)
Madelaine Bhat, CMA,acting as a scribe for Gwynneth Aliment, MD.,have documented all relevant documentation on the behalf of Gwynneth Aliment, MD,as directed by  Gwynneth Aliment, MD while in the presence of Gwynneth Aliment, MD.  Subjective:  Patient ID: Kelly Charles , female    DOB: 12-18-1975 , 47 y.o.   MRN: [id number]  Chief Complaint  Patient presents with   Hyperthyroidism    HPI  Patient presents today for a thyroid and pre-dm follow up, Patient reports compliance with medication. Patient denies any chest pain, SOB, or headaches. Patient reports right side hip pain for a few months.     Past Medical History:  Diagnosis Date   Anxiety    Carpal tunnel syndrome on both sides    Depression    Diabetes mellitus without complication (HCC)    DM2   Hx of chest pain 08/11/2020   Pt was taken via EMS to ER. No abnormal labs, cxray, or EKG (sinus tach). Pain was determined not to be emergent so patient left after 8 hours.She followed up with PCP on 08/12/21. No chest pain or SOB since as of 09/21/21. Pt stated that she was told the problem may be related to her thyroid. She has an endocrinology appt on 10/07/21 per pt.   Hyperthyroidism 2009   radioactive iodine   Trigeminal neuralgia      Family History  Problem Relation Age of Onset   Cancer Maternal Grandfather        BLADDER   Cancer Father        PROSTATE   Diabetes Father    Diabetes Mother    Mental illness Mother        ANXIETY   Aneurysm Mother        Small cerebral aneurysm   Thyroid disease Maternal Aunt    Cancer Maternal Aunt        LUNG   Hypertension Maternal Uncle    Diabetes Maternal Uncle    Hypertension Maternal Aunt    Diabetes Maternal Aunt    Mental illness Maternal Aunt    Breast cancer Maternal Aunt      Current Outpatient Medications:    buPROPion (WELLBUTRIN XL) 150 MG 24 hr tablet, Take 150 mg by mouth every morning., Disp: , Rfl:    busPIRone (BUSPAR)  15 MG tablet, Take 15 mg by mouth 3 (three) times daily., Disp: , Rfl:    DULoxetine (CYMBALTA) 60 MG capsule, One tab po bid, Disp: 180 capsule, Rfl: 0   metFORMIN (GLUCOPHAGE) 1000 MG tablet, TAKE 1 TABLET BY MOUTH EVERY DAY WITH BREAKFAST, Disp: 90 tablet, Rfl: 1   Vitamin D, Ergocalciferol, (DRISDOL) 1.25 MG (50000 UNIT) CAPS capsule, TAKE 1 CAPSULE (50,000 UNITS TOTAL) BY MOUTH EVERY 7 (SEVEN) DAYS, Disp: 13 capsule, Rfl: 3   atenolol (TENORMIN) 25 MG tablet, Take 1 tablet (25 mg total) by mouth daily., Disp: 90 tablet, Rfl: 3   Allergies  Allergen Reactions   Levaquin [Levofloxacin] Anaphylaxis    Hives, dyspepsia, shortness of breath   Penicillins Rash     Review of Systems  Constitutional: Negative.   Respiratory: Negative.    Cardiovascular: Negative.   Gastrointestinal: Negative.   Neurological: Negative.   Psychiatric/Behavioral: Negative.       Today's Vitals   10/10/23 1145  BP: 110/70  Pulse: 75  Temp: 98.5 F (36.9 C)  TempSrc: Oral  Weight: 221 lb 3.2 oz (100.3 kg)  Height: 5\' 6"  (1.676 m)  PainSc: 0-No pain   Body mass index is 35.7 kg/m.  Wt Readings from Last 3 Encounters:  10/10/23 221 lb 3.2 oz (100.3 kg)  07/12/23 216 lb (98 kg)  03/30/23 225 lb 12.8 oz (102.4 kg)    The 10-year ASCVD risk score (Arnett DK, et al., 2019) is: 0.5%   Values used to calculate the score:     Age: 89 years     Sex: Female     Is Non-Hispanic African American: Yes     Diabetic: No     Tobacco smoker: No     Systolic Blood Pressure: 110 mmHg     Is BP treated: No     HDL Cholesterol: 54 mg/dL     Total Cholesterol: 159 mg/dL  Objective:  Physical Exam Vitals and nursing note reviewed.  Constitutional:      Appearance: Normal appearance.  HENT:     Head: Normocephalic and atraumatic.  Cardiovascular:     Rate and Rhythm: Normal rate and regular rhythm.     Heart sounds: Normal heart sounds.  Pulmonary:     Effort: Pulmonary effort is normal.      Breath sounds: Normal breath sounds.  Skin:    General: Skin is warm.  Neurological:     General: No focal deficit present.     Mental Status: She is alert.  Psychiatric:        Mood and Affect: Mood normal.        Behavior: Behavior normal.        Assessment And Plan:  Prediabetes Assessment & Plan: Previous labs reviewed, her A1c has been elevated in the past. I will check an A1c today. Reminded to avoid refined sugars including sugary drinks/foods and processed meats including bacon, sausages and deli meats.    Orders: -     Hemoglobin A1c -     CMP14+EGFR  Subclinical hyperthyroidism Assessment & Plan: Chronic, she is now followed by Endo. Currently, she is asymptomatic.   Orders: -     TSH + free T4  Pain of right hip  Marital stress Assessment & Plan: She may benefit from therapy as she navigates the divorce process.  Will refer as needed.    Vitamin D deficiency disease -     VITAMIN D 25 Hydroxy (Vit-D Deficiency, Fractures) -     Magnesium  COVID-19 vaccination declined  Other orders -     Atenolol; Take 1 tablet (25 mg total) by mouth daily.  Dispense: 90 tablet; Refill: 3    Return in 6 weeks (on 11/21/2023), or pap, for keep same next.  Patient was given opportunity to ask questions. Patient verbalized understanding of the plan and was able to repeat key elements of the plan. All questions were answered to their satisfaction.    I, Gwynneth Aliment, MD, have reviewed all documentation for this visit. The documentation on 10/10/23 for the exam, diagnosis, procedures, and orders are all accurate and complete.   IF YOU HAVE BEEN REFERRED TO A SPECIALIST, IT MAY TAKE 1-2 WEEKS TO SCHEDULE/PROCESS THE REFERRAL. IF YOU HAVE NOT HEARD FROM US/SPECIALIST IN TWO WEEKS, PLEASE GIVE [place] A CALL AT [phone].

## 2023-10-11 LAB — CMP14+EGFR
ALT: 8 [IU]/L (ref 0–32)
AST: 12 [IU]/L (ref 0–40)
Albumin: 4.3 g/dL (ref 3.9–4.9)
Alkaline Phosphatase: 53 [IU]/L (ref 44–121)
BUN/Creatinine Ratio: 10 (ref 9–23)
BUN: 9 mg/dL (ref 6–24)
Bilirubin Total: 0.5 mg/dL (ref 0.0–1.2)
CO2: 22 mmol/L (ref 20–29)
Calcium: 9.4 mg/dL (ref 8.7–10.2)
Chloride: 100 mmol/L (ref 96–106)
Creatinine, Ser: 0.87 mg/dL (ref 0.57–1.00)
Globulin, Total: 2.7 g/dL (ref 1.5–4.5)
Glucose: 89 mg/dL (ref 70–99)
Potassium: 4.9 mmol/L (ref 3.5–5.2)
Sodium: 134 mmol/L (ref 134–144)
Total Protein: 7 g/dL (ref 6.0–8.5)
eGFR: 83 mL/min/{1.73_m2} (ref >59)

## 2023-10-11 LAB — TSH+FREE T4
Free T4: 1.23 ng/dL (ref 0.82–1.77)
TSH: 0.598 u[IU]/mL (ref 0.450–4.500)

## 2023-10-11 LAB — HEMOGLOBIN A1C
Est. average glucose Bld gHb Est-mCnc: 126 mg/dL
Hgb A1c MFr Bld: 6 % — ABNORMAL HIGH (ref 4.8–5.6)

## 2023-10-11 LAB — MAGNESIUM: Magnesium: 2.1 mg/dL (ref 1.6–2.3)

## 2023-10-11 LAB — VITAMIN D 25 HYDROXY (VIT D DEFICIENCY, FRACTURES): Vit D, 25-Hydroxy: 62.8 ng/mL (ref 30.0–100.0)

## 2023-10-12 ENCOUNTER — Encounter: Payer: Self-pay | Admitting: Internal Medicine

## 2023-10-15 DIAGNOSIS — Z630 Problems in relationship with spouse or partner: Secondary | ICD-10-CM | POA: Insufficient documentation

## 2023-10-15 DIAGNOSIS — M25551 Pain in right hip: Secondary | ICD-10-CM | POA: Insufficient documentation

## 2023-10-15 NOTE — Assessment & Plan Note (Signed)
She may benefit from therapy as she navigates the divorce process.  Will refer as needed.

## 2023-10-15 NOTE — Assessment & Plan Note (Signed)
Chronic, she is now followed by Endo. Currently, she is asymptomatic.

## 2023-10-15 NOTE — Assessment & Plan Note (Signed)
Previous labs reviewed, her A1c has been elevated in the past. I will check an A1c today. Reminded to avoid refined sugars including sugary drinks/foods and processed meats including bacon, sausages and deli meats.  

## 2023-10-21 DIAGNOSIS — E559 Vitamin D deficiency, unspecified: Secondary | ICD-10-CM | POA: Insufficient documentation

## 2023-10-21 NOTE — Assessment & Plan Note (Signed)
 I WILL CHECK A VIT D LEVEL AND SUPPLEMENT AS NEEDED.  ALSO ENCOURAGED TO SPEND 15 MINUTES IN THE SUN DAILY.

## 2023-10-21 NOTE — Assessment & Plan Note (Addendum)
 She may take Tylenol prn. If persistent, will consider Ortho referral for further evaluation and radiographic studies

## 2023-11-03 ENCOUNTER — Other Ambulatory Visit: Payer: Self-pay | Admitting: Internal Medicine

## 2023-11-21 NOTE — Patient Instructions (Signed)
 Prediabetes: What to Know Prediabetes is when your blood sugar, also called glucose, is at a higher level than normal but not high enough for you to be diagnosed with type 2 diabetes (type 2 diabetes mellitus). Having prediabetes puts you at risk for getting type 2 diabetes. By making some healthy changes, you may be able to prevent or delay getting type 2 diabetes. This is important because type 2 diabetes can lead to serious problems. Some of these include: Heart disease. Stroke. Blindness. Kidney disease. Depression. Poor blood flow in the feet and legs. In very bad cases, this could lead to having a leg removed by surgery (amputation). What are the causes? The exact cause of prediabetes isn't known. It may result from insulin resistance. Insulin resistance happens when cells in the body don't respond properly to insulin that the body makes. This can cause too much sugar to build up in the blood. High blood sugar, also called hyperglycemia, can develop. What increases the risk? Having a family member with type 2 diabetes. Being older than 47 years of age. Having had a temporary form of diabetes during a pregnancy. This is called gestational diabetes. Having had polycystic ovary syndrome (PCOS). Being overweight or obese. Being inactive and not getting much exercise. Having a history of heart disease. This may include problems with cholesterol levels, high levels of blood fats, or high blood pressure. What are the signs or symptoms? You may have no symptoms. If you do have symptoms, they may include: Increased hunger. Increased thirst. Needing to pee more often. Changes in how you see, like blurry vision. Feeling tired. How is this diagnosed? Prediabetes can be diagnosed with blood tests that check your blood sugar. One or more of these tests may be done: A fasting blood glucose (FBG) test. You won't be allowed to eat (you will fast) for at least 8 hours before a blood sample is  taken. An A1C blood test, also called a hemoglobin A1C test. This test shows information about blood sugar levels over the past 2?3 months. An oral glucose tolerance test (OGTT). This test measures your blood sugar at two points in time: After you haven't eaten for a while. This is your baseline level. Two hours after you drink a beverage that has sugar in it. You may be diagnosed with prediabetes if: Your FBG is 100?125 mg/dL (1.6-1.0 mmol/L). Your A1C level is 5.7?6.4% (39-46 mmol/mol). Your OGTT result is 140?199 mg/dL (9.6-04 mmol/L). These blood tests may need to be done again to be sure of the diagnosis. How is this treated? Treatment may include making changes to your diet and lifestyle. These changes can help lower your blood sugar and keep you from getting type 2 diabetes. In some cases, medicine may be given to help lower your risk. Follow these instructions at home: Eating and drinking  Eat and drink as told. Follow a healthy meal plan. This includes eating lean proteins, whole grains, legumes, fresh fruits and vegetables, low-fat dairy products, and healthy fats. Meet with an expert in healthy eating called a dietitian. This person can help create a healthy eating plan that's right for you. Lifestyle Do moderate-intensity exercise. Do this for at least 30 minutes a day on 5 or more days each week, or as told by your health care provider. A mix of activities may be best. Good choices include brisk walking, swimming, biking, and weight lifting. Try to lose weight if your provider says it's OK. Losing 5-7% of your body weight can  help reverse insulin resistance. Do not drink alcohol if: Your provider tells you not to drink. You're pregnant, may be pregnant, or plan to become pregnant. If you drink alcohol: Limit how much you have to: 0-1 drink a day if you're female. 0-2 drinks a day if you're female. Know how much alcohol is in your drink. In the [place] one drink is one 12 oz  bottle of beer (355 mL), one 5 oz glass of wine (148 mL), or one 1 oz glass of hard liquor (44 mL). General instructions Take medicines only as told. You may be given medicines that help lower the risk of type 2 diabetes. Do not smoke, vape, or use nicotine or tobacco. Where to find more information American Diabetes Association: diabetes.org/about-diabetes/prediabetes Academy of Nutrition and Dietetics: eatright.org American Heart Association: Go to ThisJobs.cz. Click the search icon. Type "prediabetes" in the search box. Contact a health care provider if: You have any of these symptoms: Increased hunger. Peeing more often than usual. Increased thirst. Feeling tired. Changes in how you see, like blurry vision. Feeling like you may throw up. Throwing up. Get help right away if: You have shortness of breath. You feel confused. This information is not intended to replace advice given to you by your health care provider. Make sure you discuss any questions you have with your health care provider. Document Revised: 05/07/2023 Document Reviewed: 05/07/2023 Elsevier Patient Education  2024 [employer].

## 2023-11-21 NOTE — Progress Notes (Signed)
 I,[name], CMA,acting as a neurosurgeon for [name], MD.,have documented all relevant documentation on the behalf of [name], MD,as directed by  [name], MD while in the presence of [name], MD.  Subjective:  Patient ID: Kelly Charles , female    DOB: 1975-11-28 , 48 y.o.   MRN: [id number]  Chief Complaint  Patient presents with   Prediabetes   Hyperthyroidism    HPI  Patient presents today for a thyroid  and pre-dm follow up, Patient reports compliance with medication. Patient denies having any chest pain, SOB, or headaches.      Past Medical History:  Diagnosis Date   Anxiety    Carpal tunnel syndrome on both sides    Depression    Diabetes mellitus without complication (HCC)    DM2   Hx of chest pain 08/11/2020   Pt was taken via EMS to ER. No abnormal labs, cxray, or EKG (sinus tach). Pain was determined not to be emergent so patient left after 8 hours.She followed up with PCP on 08/12/21. No chest pain or SOB since as of 09/21/21. Pt stated that she was told the problem may be related to her thyroid . She has an endocrinology appt on 10/07/21 per pt.   Hyperthyroidism 2009   radioactive iodine   Trigeminal neuralgia      Family History  Problem Relation Age of Onset   Cancer Maternal Grandfather        BLADDER   Cancer Father        PROSTATE   Diabetes Father    Diabetes Mother    Mental illness Mother        ANXIETY   Aneurysm Mother        Small cerebral aneurysm   Thyroid  disease Maternal Aunt    Cancer Maternal Aunt        LUNG   Hypertension Maternal Uncle    Diabetes Maternal Uncle    Hypertension Maternal Aunt    Diabetes Maternal Aunt    Mental illness Maternal Aunt    Breast cancer Maternal Aunt      Current Outpatient Medications:    atenolol  (TENORMIN ) 25 MG tablet, Take 1 tablet (25 mg total) by mouth daily., Disp: 90 tablet, Rfl: 3   buPROPion (WELLBUTRIN XL) 150 MG 24 hr tablet, Take 150 mg by  mouth every morning., Disp: , Rfl:    busPIRone  (BUSPAR ) 15 MG tablet, Take 15 mg by mouth 3 (three) times daily., Disp: , Rfl:    DULoxetine  (CYMBALTA ) 60 MG capsule, One tab po bid, Disp: 180 capsule, Rfl: 0   metFORMIN  (GLUCOPHAGE ) 1000 MG tablet, TAKE 1 TABLET BY MOUTH EVERY DAY WITH BREAKFAST, Disp: 90 tablet, Rfl: 1   Semaglutide ,0.25 or 0.5MG /DOS, 2 MG/3ML SOPN, Inject 0.5mg  sq weekly, Disp: 3 mL, Rfl: 1   Vitamin D , Ergocalciferol , (DRISDOL ) 1.25 MG (50000 UNIT) CAPS capsule, TAKE 1 CAPSULE (50,000 UNITS TOTAL) BY MOUTH EVERY 7 (SEVEN) DAYS, Disp: 13 capsule, Rfl: 3   Allergies  Allergen Reactions   Levaquin [Levofloxacin] Anaphylaxis    Hives, dyspepsia, shortness of breath   Penicillins Rash     Review of Systems  Constitutional: Negative.   Respiratory: Negative.    Cardiovascular: Negative.   Gastrointestinal: Negative.   Neurological: Negative.   Psychiatric/Behavioral: Negative.       Today's Vitals   11/22/23 0940  BP: 110/70  Pulse: 84  Temp: 98.5 F (36.9 C)  SpO2: 98%  Weight:  226 lb 6.4 oz (102.7 kg)  Height: 5' 6 (1.676 m)   Body mass index is 36.54 kg/m.  Wt Readings from Last 3 Encounters:  11/22/23 226 lb 6.4 oz (102.7 kg)  10/10/23 221 lb 3.2 oz (100.3 kg)  07/12/23 216 lb (98 kg)     Objective:  Physical Exam Vitals and nursing note reviewed.  Constitutional:      Appearance: Normal appearance.  HENT:     Head: Normocephalic and atraumatic.  Eyes:     Extraocular Movements: Extraocular movements intact.  Cardiovascular:     Rate and Rhythm: Normal rate and regular rhythm.     Heart sounds: Normal heart sounds.  Pulmonary:     Effort: Pulmonary effort is normal.     Breath sounds: Normal breath sounds.  Skin:    General: Skin is warm.  Neurological:     General: No focal deficit present.     Mental Status: She is alert.  Psychiatric:        Mood and Affect: Mood normal.        Behavior: Behavior normal.         Assessment  And Plan:  Prediabetes Assessment & Plan: Previous labs reviewed, her A1c has been elevated in the past. I will check an A1c today. Reminded to avoid refined sugars including sugary drinks/foods and processed meats including bacon, sausages and deli meats. She will continue with metformin .    Orders: -     CMP14+EGFR -     Hemoglobin A1c  Subclinical hyperthyroidism Assessment & Plan: Chronic, she is now followed by Endo. However, does not feel they are offering more information than what she gets here. I will check labs as below. She has noticed more palpitations recently.   Orders: -     TSH + free T4 -     T3, free -     T3  Palpitations -     CMP14+EGFR -     TSH + free T4 -     Magnesium -     LONG TERM MONITOR (3-14 DAYS); Future  Vitamin D  deficiency disease Assessment & Plan: I WILL CHECK A VIT D LEVEL AND SUPPLEMENT AS NEEDED.  ALSO ENCOURAGED TO SPEND 15 MINUTES IN THE SUN DAILY.   Orders: -     VITAMIN D  25 Hydroxy (Vit-D Deficiency, Fractures)  Class 2 severe obesity due to excess calories with serious comorbidity and body mass index (BMI) of 36.0 to 36.9 in adult [facility]) Assessment & Plan: She has gained 5lbs since her last visit in December. She is enocuraged to incorporate more exercise into her daily routine.     Other orders -     Semaglutide (0.25 or 0.5MG /DOS); Inject 0.5mg  sq weekly  Dispense: 3 mL; Refill: 1  She is encouraged to strive for BMI less than 30 to decrease cardiac risk. Advised to aim for at least 150 minutes of exercise per week.    Return for 4 month pre dm f/u. AND PAP.  Patient was given opportunity to ask questions. Patient verbalized understanding of the plan and was able to repeat key elements of the plan. All questions were answered to their satisfaction.    I, [name], MD, have reviewed all documentation for this visit. The documentation on 11/22/23 for the exam, diagnosis, procedures, and orders are all accurate and  complete.   IF YOU HAVE BEEN REFERRED TO A SPECIALIST, IT MAY TAKE 1-2 WEEKS TO SCHEDULE/PROCESS THE REFERRAL. IF YOU  HAVE NOT HEARD FROM US /SPECIALIST IN TWO WEEKS, PLEASE GIVE US  A CALL AT [phone] X [phone].   THE PATIENT IS ENCOURAGED TO PRACTICE SOCIAL DISTANCING DUE TO THE COVID-19 PANDEMIC.

## 2023-11-22 ENCOUNTER — Ambulatory Visit: Payer: Commercial Managed Care - HMO | Admitting: Internal Medicine

## 2023-11-22 ENCOUNTER — Encounter: Payer: Self-pay | Admitting: Internal Medicine

## 2023-11-22 ENCOUNTER — Ambulatory Visit: Payer: Self-pay | Attending: Internal Medicine

## 2023-11-22 ENCOUNTER — Other Ambulatory Visit: Payer: Self-pay | Admitting: Internal Medicine

## 2023-11-22 VITALS — BP 110/70 | HR 84 | Temp 98.5°F | Ht 66.0 in | Wt 226.4 lb

## 2023-11-22 DIAGNOSIS — E559 Vitamin D deficiency, unspecified: Secondary | ICD-10-CM | POA: Diagnosis not present

## 2023-11-22 DIAGNOSIS — E66812 Obesity, class 2: Secondary | ICD-10-CM

## 2023-11-22 DIAGNOSIS — R7303 Prediabetes: Secondary | ICD-10-CM | POA: Diagnosis not present

## 2023-11-22 DIAGNOSIS — E0590 Thyrotoxicosis, unspecified without thyrotoxic crisis or storm: Secondary | ICD-10-CM | POA: Diagnosis not present

## 2023-11-22 DIAGNOSIS — R002 Palpitations: Secondary | ICD-10-CM

## 2023-11-22 DIAGNOSIS — Z6836 Body mass index (BMI) 36.0-36.9, adult: Secondary | ICD-10-CM

## 2023-11-22 MED ORDER — SEMAGLUTIDE(0.25 OR 0.5MG/DOS) 2 MG/3ML ~~LOC~~ SOPN: PEN_INJECTOR | SUBCUTANEOUS | 1 refills | Status: DC

## 2023-11-22 NOTE — Assessment & Plan Note (Signed)
 She has gained 5lbs since her last visit in December. She is enocuraged to incorporate more exercise into her daily routine.

## 2023-11-22 NOTE — Assessment & Plan Note (Signed)
I WILL CHECK A VIT D LEVEL AND SUPPLEMENT AS NEEDED.  ALSO ENCOURAGED TO SPEND 15 MINUTES IN THE SUN DAILY.

## 2023-11-22 NOTE — Assessment & Plan Note (Signed)
 Chronic, she is now followed by Endo. However, does not feel they are offering more information than what she gets here. I will check labs as below. She has noticed more palpitations recently.

## 2023-11-22 NOTE — Progress Notes (Unsigned)
 EP to read.

## 2023-11-22 NOTE — Assessment & Plan Note (Addendum)
 Previous labs reviewed, her A1c has been elevated in the past. I will check an A1c today. Reminded to avoid refined sugars including sugary drinks/foods and processed meats including bacon, sausages and deli meats. She will continue with metformin .

## 2023-11-23 LAB — CMP14+EGFR
ALT: 10 [IU]/L (ref 0–32)
AST: 13 [IU]/L (ref 0–40)
Albumin: 4.4 g/dL (ref 3.9–4.9)
Alkaline Phosphatase: 50 [IU]/L (ref 44–121)
BUN/Creatinine Ratio: 8 — ABNORMAL LOW (ref 9–23)
BUN: 8 mg/dL (ref 6–24)
Bilirubin Total: 0.6 mg/dL (ref 0.0–1.2)
CO2: 17 mmol/L — ABNORMAL LOW (ref 20–29)
Calcium: 10 mg/dL (ref 8.7–10.2)
Chloride: 101 mmol/L (ref 96–106)
Creatinine, Ser: 0.95 mg/dL (ref 0.57–1.00)
Globulin, Total: 2.9 g/dL (ref 1.5–4.5)
Glucose: 77 mg/dL (ref 70–99)
Potassium: 4.6 mmol/L (ref 3.5–5.2)
Sodium: 137 mmol/L (ref 134–144)
Total Protein: 7.3 g/dL (ref 6.0–8.5)
eGFR: 75 mL/min/{1.73_m2} (ref >59)

## 2023-11-23 LAB — HEMOGLOBIN A1C
Est. average glucose Bld gHb Est-mCnc: 134 mg/dL
Hgb A1c MFr Bld: 6.3 % — ABNORMAL HIGH (ref 4.8–5.6)

## 2023-11-23 LAB — VITAMIN D 25 HYDROXY (VIT D DEFICIENCY, FRACTURES): Vit D, 25-Hydroxy: 54.8 ng/mL (ref 30.0–100.0)

## 2023-11-23 LAB — TSH+FREE T4
Free T4: 1.13 ng/dL (ref 0.82–1.77)
TSH: 0.332 u[IU]/mL — ABNORMAL LOW (ref 0.450–4.500)

## 2023-11-23 LAB — T3: T3, Total: 127 ng/dL (ref 71–180)

## 2023-11-23 LAB — MAGNESIUM: Magnesium: 1.9 mg/dL (ref 1.6–2.3)

## 2023-11-23 LAB — T3, FREE: T3, Free: 2.9 pg/mL (ref 2.0–4.4)

## 2023-11-24 DIAGNOSIS — R002 Palpitations: Secondary | ICD-10-CM | POA: Diagnosis not present

## 2023-12-08 ENCOUNTER — Encounter: Payer: Self-pay | Admitting: Internal Medicine

## 2024-03-02 ENCOUNTER — Other Ambulatory Visit: Payer: Self-pay | Admitting: Internal Medicine

## 2024-03-26 ENCOUNTER — Ambulatory Visit: Payer: Commercial Managed Care - HMO | Admitting: Internal Medicine

## 2024-04-08 ENCOUNTER — Ambulatory Visit (INDEPENDENT_AMBULATORY_CARE_PROVIDER_SITE_OTHER): Payer: Commercial Managed Care - HMO | Admitting: Internal Medicine

## 2024-04-08 ENCOUNTER — Other Ambulatory Visit (HOSPITAL_COMMUNITY)
Admission: RE | Admit: 2024-04-08 | Discharge: 2024-04-08 | Disposition: A | Discharge status: Home / Self Care | Source: Ambulatory Visit | Attending: Internal Medicine | Admitting: Internal Medicine

## 2024-04-08 ENCOUNTER — Encounter: Payer: Self-pay | Admitting: Internal Medicine

## 2024-04-08 VITALS — BP 110/80 | HR 98 | Temp 98.5°F | Ht 66.0 in | Wt 219.0 lb

## 2024-04-08 DIAGNOSIS — E66812 Obesity, class 2: Secondary | ICD-10-CM

## 2024-04-08 DIAGNOSIS — E559 Vitamin D deficiency, unspecified: Secondary | ICD-10-CM

## 2024-04-08 DIAGNOSIS — E0590 Thyrotoxicosis, unspecified without thyrotoxic crisis or storm: Secondary | ICD-10-CM | POA: Diagnosis not present

## 2024-04-08 DIAGNOSIS — R7303 Prediabetes: Secondary | ICD-10-CM

## 2024-04-08 DIAGNOSIS — Z8249 Family history of ischemic heart disease and other diseases of the circulatory system: Secondary | ICD-10-CM

## 2024-04-08 DIAGNOSIS — Z0000 Encounter for general adult medical examination without abnormal findings: Secondary | ICD-10-CM | POA: Diagnosis present

## 2024-04-08 DIAGNOSIS — R002 Palpitations: Secondary | ICD-10-CM

## 2024-04-08 DIAGNOSIS — Z1231 Encounter for screening mammogram for malignant neoplasm of breast: Secondary | ICD-10-CM

## 2024-04-08 DIAGNOSIS — Z6835 Body mass index (BMI) 35.0-35.9, adult: Secondary | ICD-10-CM

## 2024-04-08 NOTE — Progress Notes (Signed)
 I,Kelly Charles, CMA,acting as a Neurosurgeon for Kelly LOISE Slocumb, MD.,have documented all relevant documentation on the behalf of Kelly LOISE Slocumb, MD,as directed by  Kelly LOISE Slocumb, MD while in the presence of Kelly LOISE Slocumb, MD.  Subjective:    Patient ID: Kelly Charles , female    DOB: Nov 24, 1975 , 48 y.o.   MRN: [id number]  Chief Complaint  Patient presents with   Annual Exam    Patient presents today for annual exam. She has no specific questions or concerns. Denies headache, chest pain & sob.   Prediabetes   Hyperthyroidism    HPI Discussed the use of AI scribe software for clinical note transcription with the patient, who gave verbal consent to proceed.  History of Present Illness Kelly Charles is a 48 year old female who presents for an annual physical exam and a Pap smear.  She has a history of atypical cells found in a Pap smear two years ago. She is currently taking metformin , duloxetine  twice a day, Buspar , bupropion 200 mg, atenolol  daily, and vitamin D  5000 units in gel cap form.  She had a heart monitor in March, which showed a heart rate range from 65 to 137 bpm, predominantly sinus rhythm, and an episode of supraventricular tachycardia. She has a family history of heart disease.  She is overdue for a mammogram, which was due in January, and has not yet scheduled it. She reports no recent sexual activity, regular bowel movements, and experiences a milky discharge intermittently, which she associates with her menstrual cycle.  She is not currently engaging in intentional exercise but remains active throughout the day.  Past Medical History:  Diagnosis Date   Anxiety    Carpal tunnel syndrome on both sides    Depression    Diabetes mellitus without complication (HCC)    DM2   Hx of chest pain 08/11/2020   Pt was taken via EMS to ER. No abnormal labs, cxray, or EKG (sinus tach). Pain was determined not to be emergent  so patient left after 8 hours.She followed up with PCP on 08/12/21. No chest pain or SOB since as of 09/21/21. Pt stated that she was told the problem may be related to her thyroid . She has an endocrinology appt on 10/07/21 per pt.   Hyperthyroidism 2009   radioactive iodine   Trigeminal neuralgia      Family History  Problem Relation Age of Onset   Cancer Maternal Grandfather        BLADDER   Cancer Father        PROSTATE   Diabetes Father    Diabetes Mother    Mental illness Mother        ANXIETY   Aneurysm Mother        Small cerebral aneurysm   Thyroid  disease Maternal Aunt    Cancer Maternal Aunt        LUNG   Hypertension Maternal Uncle    Diabetes Maternal Uncle    Hypertension Maternal Aunt    Diabetes Maternal Aunt    Mental illness Maternal Aunt    Breast cancer Maternal Aunt      Current Outpatient Medications:    atenolol  (TENORMIN ) 25 MG tablet, Take 1 tablet (25 mg total) by mouth daily., Disp: 90 tablet, Rfl: 3   buPROPion (WELLBUTRIN SR) 200 MG 12 hr tablet, Take 200 mg by mouth every morning., Disp: , Rfl:    busPIRone  (BUSPAR ) 15 MG tablet, Take 15  mg by mouth 3 (three) times daily., Disp: , Rfl:    cholecalciferol (VITAMIN D3) 25 MCG (1000 UNIT) tablet, Take 5,000 Units by mouth., Disp: , Rfl:    DULoxetine  (CYMBALTA ) 60 MG capsule, One tab po bid, Disp: 180 capsule, Rfl: 0   metFORMIN  (GLUCOPHAGE ) 1000 MG tablet, TAKE 1 TABLET BY MOUTH EVERY DAY WITH BREAKFAST, Disp: 90 tablet, Rfl: 1   Vitamin D , Ergocalciferol , (DRISDOL ) 1.25 MG (50000 UNIT) CAPS capsule, TAKE 1 CAPSULE (50,000 UNITS TOTAL) BY MOUTH EVERY 7 (SEVEN) DAYS, Disp: 12 capsule, Rfl: 4   Allergies  Allergen Reactions   Levaquin [Levofloxacin] Anaphylaxis    Hives, dyspepsia, shortness of breath   Penicillins Rash      The patient states she uses none for birth control. No LMP recorded (exact date).. Negative for Menorrhagia. Negative for: breast discharge, breast lump(s), breast pain and  breast self exam. Associated symptoms include abnormal vaginal bleeding. Pertinent negatives include abnormal bleeding (hematology), anxiety, decreased libido, depression, difficulty falling sleep, dyspareunia, history of infertility, nocturia, sexual dysfunction, sleep disturbances, urinary incontinence, urinary urgency, vaginal discharge and vaginal itching. Diet regular.The patient states her exercise level is  intermittent.  . The patient's tobacco use is:  Social History   Tobacco Use  Smoking Status Never  Smokeless Tobacco Never  . She has been exposed to passive smoke. The patient's alcohol use is:  Social History   Substance and Sexual Activity  Alcohol Use No    Review of Systems  Constitutional: Negative.   HENT: Negative.    Eyes: Negative.   Respiratory: Negative.    Cardiovascular: Negative.   Gastrointestinal: Negative.   Endocrine: Negative.   Genitourinary: Negative.   Musculoskeletal: Negative.   Skin: Negative.   Allergic/Immunologic: Negative.   Neurological: Negative.   Hematological: Negative.   Psychiatric/Behavioral: Negative.       Today's Vitals   04/08/24 0928  BP: 110/80  Pulse: 98  Temp: 98.5 F (36.9 C)  SpO2: 98%  Weight: 219 lb (99.3 kg)  Height: 5' 6 (1.676 m)   Body mass index is 35.35 kg/m.  Wt Readings from Last 3 Encounters:  04/08/24 219 lb (99.3 kg)  11/22/23 226 lb 6.4 oz (102.7 kg)  10/10/23 221 lb 3.2 oz (100.3 kg)     Objective:  Physical Exam Vitals and nursing note reviewed.  Constitutional:      Appearance: Normal appearance. She is obese.  HENT:     Head: Normocephalic and atraumatic.     Right Ear: Tympanic membrane, ear canal and external ear normal.     Left Ear: Tympanic membrane, ear canal and external ear normal.     Nose: Nose normal.     Mouth/Throat:     Mouth: Mucous membranes are moist.     Pharynx: Oropharynx is clear.   Eyes:     Extraocular Movements: Extraocular movements intact.      Conjunctiva/sclera: Conjunctivae normal.     Pupils: Pupils are equal, round, and reactive to light.    Cardiovascular:     Rate and Rhythm: Normal rate and regular rhythm.     Pulses: Normal pulses.     Heart sounds: Normal heart sounds.  Pulmonary:     Effort: Pulmonary effort is normal.     Breath sounds: Normal breath sounds.  Chest:  Breasts:    Tanner Score is 5.     Right: Normal.     Left: Normal.  Abdominal:     General: Bowel sounds are  normal.     Palpations: Abdomen is soft.  Genitourinary:    Comments: deferred  Musculoskeletal:        General: Normal range of motion.     Cervical back: Normal range of motion and neck supple.   Skin:    General: Skin is warm and dry.   Neurological:     General: No focal deficit present.     Mental Status: She is alert and oriented to person, place, and time.     Motor: No weakness.     Coordination: Coordination normal.   Psychiatric:        Mood and Affect: Mood normal.        Behavior: Behavior normal.         Assessment And Plan:     Encounter for general adult medical examination w/o abnormal findings Assessment & Plan: A full exam was performed, including pelvic exam.  Importance of monthly self breast exams was discussed with the patient.  She is advised to get 30-45 minutes of regular exercise, no less than four to five days per week. Both weight-bearing and aerobic exercises are recommended.  She is advised to follow a healthy diet with at least six fruits/veggies per day, decrease intake of red meat and other saturated fats and to increase fish intake to twice weekly.  Meats/fish should not be fried -- baked, boiled or broiled is preferable. It is also important to cut back on your sugar intake.  Be sure to read labels - try to avoid anything with added sugar, high fructose corn syrup or other sweeteners.  If you must use a sweetener, you can try stevia or monkfruit.  It is also important to avoid artificially  sweetened foods/beverages and diet drinks. Lastly, wear SPF 50 sunscreen on exposed skin and when in direct sunlight for an extended period of time.  Be sure to avoid fast food restaurants and aim for at least 60 ounces of water daily.      Orders: -     CBC -     CMP14+EGFR -     Lipid panel -     Lipoprotein A (LPA) -     Cytology - PAP  Prediabetes Assessment & Plan: Previous labs reviewed, her A1c has been elevated in the past. I will check an A1c today. Reminded to avoid refined sugars including sugary drinks/foods and processed meats including bacon, sausages and deli meats. She will continue with metformin .    Orders: -     Hemoglobin A1c -     Insulin , random  Subclinical hyperthyroidism Assessment & Plan: Chronic, previously followed by Endo. I will check labs as below. She agrees to return to Endo if labs are no longer stable.   Orders: -     TSH -     T4, free  Palpitations Assessment & Plan: Intermittent.  She has worn Zio monitor in the past.  Episode noted on monitor with sinus rhythm, no life-threatening arrhythmias. Discussed hydration and electrolyte balance importance. - Advise staying hydrated. - Recommend multivitamin with minerals.   Class 2 severe obesity due to excess calories with serious comorbidity and body mass index (BMI) of 35.0 to 35.9 in adult [facility]) Assessment & Plan: She is encouraged to strive for BMI<30 to decrease cardiac risk.  She is advised to aim for at least 150 minutes of exercise per week.      Family history of heart disease Assessment & Plan:  We discussed the use of  lipoprotein A test to assess genetic risk, calcium score test if elevated.  - Order lipoprotein A test. - Encourage regular exercise. - Discuss calcium score test if lipoprotein A is elevated.   Encounter for screening mammogram for malignant neoplasm of breast -     3D Screening Mammogram, Left and Right; Future   Return for 1 year HM, 4 month pre dm  f/u.SABRA Patient was given opportunity to ask questions. Patient verbalized understanding of the plan and was able to repeat key elements of the plan. All questions were answered to their satisfaction.   I, Kelly LOISE Slocumb, MD, have reviewed all documentation for this visit. The documentation on 04/08/24 for the exam, diagnosis, procedures, and orders are all accurate and complete.

## 2024-04-08 NOTE — Patient Instructions (Signed)

## 2024-04-09 LAB — CMP14+EGFR
ALT: 7 IU/L (ref 0–32)
AST: 8 IU/L (ref 0–40)
Albumin: 4.2 g/dL (ref 3.9–4.9)
Alkaline Phosphatase: 65 IU/L (ref 44–121)
BUN/Creatinine Ratio: 13 (ref 9–23)
BUN: 11 mg/dL (ref 6–24)
Bilirubin Total: 0.5 mg/dL (ref 0.0–1.2)
CO2: 20 mmol/L (ref 20–29)
Calcium: 9.7 mg/dL (ref 8.7–10.2)
Chloride: 103 mmol/L (ref 96–106)
Creatinine, Ser: 0.88 mg/dL (ref 0.57–1.00)
Globulin, Total: 3.1 g/dL (ref 1.5–4.5)
Glucose: 89 mg/dL (ref 70–99)
Potassium: 4.7 mmol/L (ref 3.5–5.2)
Sodium: 139 mmol/L (ref 134–144)
Total Protein: 7.3 g/dL (ref 6.0–8.5)
eGFR: 82 mL/min/{1.73_m2} (ref >59)

## 2024-04-09 LAB — LIPID PANEL
Chol/HDL Ratio: 3.3 ratio (ref 0.0–4.4)
Cholesterol, Total: 189 mg/dL (ref 100–199)
HDL: 58 mg/dL (ref >39)
LDL Chol Calc (NIH): 113 mg/dL — ABNORMAL HIGH (ref 0–99)
Triglycerides: 101 mg/dL (ref 0–149)
VLDL Cholesterol Cal: 18 mg/dL (ref 5–40)

## 2024-04-09 LAB — LIPOPROTEIN A (LPA): Lipoprotein (a): 107.4 nmol/L — ABNORMAL HIGH (ref <75.0)

## 2024-04-09 LAB — TSH: TSH: 0.534 u[IU]/mL (ref 0.450–4.500)

## 2024-04-09 LAB — CBC
Hematocrit: 38.1 % (ref 34.0–46.6)
Hemoglobin: 11.1 g/dL (ref 11.1–15.9)
MCH: 22.1 pg — ABNORMAL LOW (ref 26.6–33.0)
MCHC: 29.1 g/dL — ABNORMAL LOW (ref 31.5–35.7)
MCV: 76 fL — ABNORMAL LOW (ref 79–97)
Platelets: 462 10*3/uL — ABNORMAL HIGH (ref 150–450)
RBC: 5.02 x10E6/uL (ref 3.77–5.28)
RDW: 14.7 % (ref 11.7–15.4)
WBC: 5 10*3/uL (ref 3.4–10.8)

## 2024-04-09 LAB — INSULIN, RANDOM: INSULIN: 15.5 u[IU]/mL (ref 2.6–24.9)

## 2024-04-09 LAB — HEMOGLOBIN A1C
Est. average glucose Bld gHb Est-mCnc: 126 mg/dL
Hgb A1c MFr Bld: 6 % — ABNORMAL HIGH (ref 4.8–5.6)

## 2024-04-09 LAB — T4, FREE: Free T4: 1.07 ng/dL (ref 0.82–1.77)

## 2024-04-11 ENCOUNTER — Ambulatory Visit
Admission: RE | Admit: 2024-04-11 | Discharge: 2024-04-11 | Disposition: A | Discharge status: Home / Self Care | Payer: Self-pay | Source: Ambulatory Visit | Attending: Internal Medicine | Admitting: Internal Medicine

## 2024-04-11 DIAGNOSIS — Z1231 Encounter for screening mammogram for malignant neoplasm of breast: Secondary | ICD-10-CM

## 2024-04-11 LAB — CYTOLOGY - PAP
Comment: NEGATIVE
Diagnosis: UNDETERMINED — AB
High risk HPV: NEGATIVE

## 2024-04-14 ENCOUNTER — Ambulatory Visit: Payer: Self-pay | Admitting: Internal Medicine

## 2024-04-14 DIAGNOSIS — R002 Palpitations: Secondary | ICD-10-CM | POA: Insufficient documentation

## 2024-04-14 DIAGNOSIS — Z8249 Family history of ischemic heart disease and other diseases of the circulatory system: Secondary | ICD-10-CM | POA: Insufficient documentation

## 2024-04-14 NOTE — Assessment & Plan Note (Signed)
 Previous labs reviewed, her A1c has been elevated in the past. I will check an A1c today. Reminded to avoid refined sugars including sugary drinks/foods and processed meats including bacon, sausages and deli meats. She will continue with metformin .

## 2024-04-14 NOTE — Assessment & Plan Note (Signed)
 We discussed the use of lipoprotein A test to assess genetic risk, calcium score test if elevated.  - Order lipoprotein A test. - Encourage regular exercise. - Discuss calcium score test if lipoprotein A is elevated.

## 2024-04-14 NOTE — Assessment & Plan Note (Signed)
 She is encouraged to strive for BMI<30 to decrease cardiac risk.  She is advised to aim for at least 150 minutes of exercise per week.

## 2024-04-14 NOTE — Assessment & Plan Note (Signed)
 Chronic, previously followed by Endo. I will check labs as below. She agrees to return to Endo if labs are no longer stable.

## 2024-04-14 NOTE — Assessment & Plan Note (Addendum)
 A full exam was performed, including pelvic exam.  Importance of monthly self breast exams was discussed with the patient.  She is advised to get 30-45 minutes of regular exercise, no less than four to five days per week. Both weight-bearing and aerobic exercises are recommended.  She is advised to follow a healthy diet with at least six fruits/veggies per day, decrease intake of red meat and other saturated fats and to increase fish intake to twice weekly.  Meats/fish should not be fried -- baked, boiled or broiled is preferable. It is also important to cut back on your sugar intake.  Be sure to read labels - try to avoid anything with added sugar, high fructose corn syrup or other sweeteners.  If you must use a sweetener, you can try stevia or monkfruit.  It is also important to avoid artificially sweetened foods/beverages and diet drinks. Lastly, wear SPF 50 sunscreen on exposed skin and when in direct sunlight for an extended period of time.  Be sure to avoid fast food restaurants and aim for at least 60 ounces of water daily.

## 2024-04-14 NOTE — Assessment & Plan Note (Signed)
 Intermittent.  She has worn Zio monitor in the past.  Episode noted on monitor with sinus rhythm, no life-threatening arrhythmias. Discussed hydration and electrolyte balance importance. - Advise staying hydrated. - Recommend multivitamin with minerals.

## 2024-04-22 ENCOUNTER — Other Ambulatory Visit: Payer: Self-pay | Admitting: Internal Medicine

## 2024-07-14 ENCOUNTER — Encounter: Payer: Self-pay | Admitting: Internal Medicine

## 2024-07-17 ENCOUNTER — Ambulatory Visit: Payer: Self-pay | Admitting: Family Medicine

## 2024-07-17 ENCOUNTER — Encounter: Payer: Self-pay | Admitting: Family Medicine

## 2024-07-17 VITALS — BP 120/64 | HR 85 | Temp 98.4°F | Ht 66.0 in | Wt 220.0 lb

## 2024-07-17 DIAGNOSIS — Z23 Encounter for immunization: Secondary | ICD-10-CM | POA: Diagnosis not present

## 2024-07-17 DIAGNOSIS — E66812 Obesity, class 2: Secondary | ICD-10-CM

## 2024-07-17 DIAGNOSIS — F32A Depression, unspecified: Secondary | ICD-10-CM | POA: Diagnosis not present

## 2024-07-17 DIAGNOSIS — Z6835 Body mass index (BMI) 35.0-35.9, adult: Secondary | ICD-10-CM

## 2024-07-17 DIAGNOSIS — F419 Anxiety disorder, unspecified: Secondary | ICD-10-CM

## 2024-07-17 NOTE — Patient Instructions (Signed)

## 2024-07-17 NOTE — Assessment & Plan Note (Signed)
 Increased depression and anxiety symptoms affecting daily life.   Managed by psychiatrist Dr. Akintayo.  - Advised to consult Dr. Akintayo regarding new symptoms and potential medical adjustment.  - She was advised to reach back if no feedback from Psychiatrist in 1-2 weeks.

## 2024-07-17 NOTE — Assessment & Plan Note (Signed)
 She is encouraged to strive for BMI less than 30 to decrease cardiac risk. Advised to aim for at least 150 minutes of exercise per week.

## 2024-07-17 NOTE — Progress Notes (Signed)
 I,Kelly Charles, CMA,acting as a Neurosurgeon for [employer], NP.,have documented all relevant documentation on the behalf of Kelly Creighton, NP,as directed by  Kelly Creighton, NP while in the presence of Kelly Creighton, NP.  Subjective:  Patient ID: Kelly Charles , female    DOB: 1976-09-26 , 48 y.o.   MRN: [id number]  Chief Complaint  Patient presents with   Anxiety   Depression    Patient presents today for anxiety and depression. She reports she has always had it but within the last 3 weeks her symptoms has increased. She has been feeling really distracted, shaking, brain fog and difficulty concentrating.     HPI Discussed the use of AI scribe software for clinical note transcription with the patient, who gave verbal consent to proceed.  History of Present Illness       Kelly Charles Kelly Charles is a 48 year old female who presents with increased anxiety and depression symptoms.  Over the past three weeks, she has experienced worsening anxiety and depression, characterized by an inability to complete tasks, lack of motivation, easy distractibility, forgetfulness, and poor concentration. These symptoms are significantly impacting her work and home life.  She is currently taking Wellbutrin 200 mg, Buspar  15 mg three times a day, duloxetine  60 mg twice a day, atenolol  for increased heart rate, metformin , and vitamin D . Despite these medications, her symptoms have not improved.  She experiences a sensation of 'shakiness inside' upon waking, which she attributes to anxiety. Recent thyroid  and vitamin D  levels were checked and found to be normal.  She sees a psychiatrist for medication management and has regular monthly appointments. She also mentions a concern about potential undiagnosed ADHD, as she has noticed symptoms such as losing concentration.  No need for medication refills and no recent exercise. She confirms taking her atenolol  at night and has  received her flu shot.  BMI is 35.51, She is encouraged to strive for BMI less than 30 to decrease cardiac risk. Advised to aim for at least 150 minutes of exercise per week.      Past Medical History:  Diagnosis Date   Anxiety    Carpal tunnel syndrome on both sides    Depression    Diabetes mellitus without complication (HCC)    DM2   Hx of chest pain 08/11/2020   Pt was taken via EMS to ER. No abnormal labs, cxray, or EKG (sinus tach). Pain was determined not to be emergent so patient left after 8 hours.She followed up with PCP on 08/12/21. No chest pain or SOB since as of 09/21/21. Pt stated that she was told the problem may be related to her thyroid . She has an endocrinology appt on 10/07/21 per pt.   Hyperthyroidism 2009   radioactive iodine   Trigeminal neuralgia      Family History  Problem Relation Age of Onset   Cancer Maternal Grandfather        BLADDER   Cancer Father        PROSTATE   Diabetes Father    Diabetes Mother    Mental illness Mother        ANXIETY   Aneurysm Mother        Small cerebral aneurysm   Thyroid  disease Maternal Aunt    Cancer Maternal Aunt        LUNG   Hypertension Maternal Uncle    Diabetes Maternal Uncle    Hypertension Maternal Aunt    Diabetes Maternal Aunt  Mental illness Maternal Aunt    Breast cancer Maternal Aunt      Current Outpatient Medications:    atenolol  (TENORMIN ) 25 MG tablet, Take 1 tablet (25 mg total) by mouth daily., Disp: 90 tablet, Rfl: 3   buPROPion (WELLBUTRIN SR) 200 MG 12 hr tablet, Take 200 mg by mouth every morning., Disp: , Rfl:    busPIRone  (BUSPAR ) 15 MG tablet, Take 15 mg by mouth 3 (three) times daily., Disp: , Rfl:    cholecalciferol (VITAMIN D3) 25 MCG (1000 UNIT) tablet, Take 5,000 Units by mouth., Disp: , Rfl:    DULoxetine  (CYMBALTA ) 60 MG capsule, One tab po bid, Disp: 180 capsule, Rfl: 0   metFORMIN  (GLUCOPHAGE ) 1000 MG tablet, TAKE 1 TABLET BY MOUTH EVERY DAY WITH BREAKFAST, Disp: 90  tablet, Rfl: 1   Vitamin D , Ergocalciferol , (DRISDOL ) 1.25 MG (50000 UNIT) CAPS capsule, TAKE 1 CAPSULE (50,000 UNITS TOTAL) BY MOUTH EVERY 7 (SEVEN) DAYS, Disp: 12 capsule, Rfl: 4   Allergies  Allergen Reactions   Levaquin [Levofloxacin] Anaphylaxis    Hives, dyspepsia, shortness of breath   Penicillins Rash     Review of Systems  Constitutional: Negative.   Respiratory: Negative.    Cardiovascular: Negative.   Gastrointestinal: Negative.   Psychiatric/Behavioral:  Positive for dysphoric mood. The patient is nervous/anxious and is hyperactive.      Today's Vitals   07/17/24 0811  BP: 120/64  Pulse: 85  Temp: 98.4 F (36.9 C)  TempSrc: Oral  Weight: 220 lb (99.8 kg)  Height: 5' 6 (1.676 m)  PainSc: 0-No pain   Body mass index is 35.51 kg/m.  Wt Readings from Last 3 Encounters:  07/17/24 220 lb (99.8 kg)  04/08/24 219 lb (99.3 kg)  11/22/23 226 lb 6.4 oz (102.7 kg)    The 10-year ASCVD risk score (Arnett DK, et al., 2019) is: 0.8%   Values used to calculate the score:     Age: 47 years     Clincally relevant sex: Female     Is Non-Hispanic African American: Yes     Diabetic: No     Tobacco smoker: No     Systolic Blood Pressure: 120 mmHg     Is BP treated: No     HDL Cholesterol: 58 mg/dL     Total Cholesterol: 189 mg/dL  Objective:  Physical Exam Constitutional:      Appearance: Normal appearance.  Cardiovascular:     Rate and Rhythm: Normal rate and regular rhythm.     Pulses: Normal pulses.     Heart sounds: Normal heart sounds.  Pulmonary:     Effort: Pulmonary effort is normal.     Breath sounds: Normal breath sounds.  Abdominal:     General: Bowel sounds are normal.  Neurological:     Mental Status: She is alert and oriented to person, place, and time.  Psychiatric:        Mood and Affect: Mood is anxious and depressed.        Behavior: Behavior is cooperative.         Assessment And Plan:  Anxiety and depression Assessment &  Plan: Increased depression and anxiety symptoms affecting daily life.   Managed by psychiatrist Dr. Akintayo.  - Advised to consult Dr. Akintayo regarding new symptoms and potential medical adjustment.  - She was advised to reach back if no feedback from Psychiatrist in 1-2 weeks.   Need for influenza vaccination -     Flu vaccine trivalent PF, 6mos  and older(Flulaval,Afluria,Fluarix ,Fluzone)  Class 2 severe obesity due to excess calories with serious comorbidity and body mass index (BMI) of 35.0 to 35.9 in adult Assessment & Plan: She is encouraged to strive for BMI less than 30 to decrease cardiac risk. Advised to aim for at least 150 minutes of exercise per week.     Assessment & Plan Depression and Anxiety Disorder Increased depression and anxiety symptoms affecting daily life. Managed by psychiatrist Dr. Akintayo.  - Consult Dr. Sable regarding new symptoms  - Encouraged regular exercise, 15-20 minutes walking, 3-4 X daily. - Contact Dr. Dionne office for appointment and possible medication adjustment. Advised to reach back to our office if there is a delay from Dr Dionne office    Return for keep appt.  Patient was given opportunity to ask questions. Patient verbalized understanding of the plan and was able to repeat key elements of the plan. All questions were answered to their satisfaction.    I, Kelly Creighton, NP, have reviewed all documentation for this visit. The documentation on 07/17/2024 for the exam, diagnosis, procedures, and orders are all accurate and complete.    IF YOU HAVE BEEN REFERRED TO A SPECIALIST, IT MAY TAKE 1-2 WEEKS TO SCHEDULE/PROCESS THE REFERRAL. IF YOU HAVE NOT HEARD FROM US /SPECIALIST IN TWO WEEKS, PLEASE GIVE US  A CALL AT [phone].

## 2024-07-22 ENCOUNTER — Ambulatory Visit: Payer: Self-pay | Admitting: Internal Medicine

## 2024-08-02 ENCOUNTER — Other Ambulatory Visit: Payer: Self-pay | Admitting: Medical Genetics

## 2024-08-27 ENCOUNTER — Ambulatory Visit: Payer: Self-pay | Admitting: Internal Medicine

## 2024-08-27 VITALS — BP 110/80 | HR 84 | Temp 98.4°F | Ht 66.0 in | Wt 224.0 lb

## 2024-08-27 DIAGNOSIS — E0590 Thyrotoxicosis, unspecified without thyrotoxic crisis or storm: Secondary | ICD-10-CM

## 2024-08-27 DIAGNOSIS — F32A Depression, unspecified: Secondary | ICD-10-CM

## 2024-08-27 DIAGNOSIS — F419 Anxiety disorder, unspecified: Secondary | ICD-10-CM

## 2024-08-27 DIAGNOSIS — R7303 Prediabetes: Secondary | ICD-10-CM

## 2024-08-27 NOTE — Patient Instructions (Signed)
 Prediabetes: What to Know Prediabetes is when your blood sugar, also called glucose, is at a higher level than normal but not high enough for you to be diagnosed with type 2 diabetes (type 2 diabetes mellitus). Having prediabetes puts you at risk for getting type 2 diabetes. By making some healthy changes, you may be able to prevent or delay getting type 2 diabetes. This is important because type 2 diabetes can lead to serious problems. Some of these include: Heart disease. Stroke. Blindness. Kidney disease. Depression. Poor blood flow in the feet and legs. In very bad cases, this could lead to having a leg removed by surgery (amputation). What are the causes? The exact cause of prediabetes isn't known. It may result from insulin resistance. Insulin resistance happens when cells in the body don't respond properly to insulin that the body makes. This can cause too much sugar to build up in the blood. High blood sugar, also called hyperglycemia, can develop. What increases the risk? Having a family member with type 2 diabetes. Being older than 47 years of age. Having had a temporary form of diabetes during a pregnancy. This is called gestational diabetes. Having had polycystic ovary syndrome (PCOS). Being overweight or obese. Being inactive and not getting much exercise. Having a history of heart disease. This may include problems with cholesterol levels, high levels of blood fats, or high blood pressure. What are the signs or symptoms? You may have no symptoms. If you do have symptoms, they may include: Increased hunger. Increased thirst. Needing to pee more often. Changes in how you see, like blurry vision. Feeling tired. How is this diagnosed? Prediabetes can be diagnosed with blood tests that check your blood sugar. One or more of these tests may be done: A fasting blood glucose (FBG) test. You won't be allowed to eat (you will fast) for at least 8 hours before a blood sample is  taken. An A1C blood test, also called a hemoglobin A1C test. This test shows information about blood sugar levels over the past 2?3 months. An oral glucose tolerance test (OGTT). This test measures your blood sugar at two points in time: After you haven't eaten for a while. This is your baseline level. Two hours after you drink a beverage that has sugar in it. You may be diagnosed with prediabetes if: Your FBG is 100?125 mg/dL (1.6-1.0 mmol/L). Your A1C level is 5.7?6.4% (39-46 mmol/mol). Your OGTT result is 140?199 mg/dL (9.6-04 mmol/L). These blood tests may need to be done again to be sure of the diagnosis. How is this treated? Treatment may include making changes to your diet and lifestyle. These changes can help lower your blood sugar and keep you from getting type 2 diabetes. In some cases, medicine may be given to help lower your risk. Follow these instructions at home: Eating and drinking  Eat and drink as told. Follow a healthy meal plan. This includes eating lean proteins, whole grains, legumes, fresh fruits and vegetables, low-fat dairy products, and healthy fats. Meet with an expert in healthy eating called a dietitian. This person can help create a healthy eating plan that's right for you. Lifestyle Do moderate-intensity exercise. Do this for at least 30 minutes a day on 5 or more days each week, or as told by your health care provider. A mix of activities may be best. Good choices include brisk walking, swimming, biking, and weight lifting. Try to lose weight if your provider says it's OK. Losing 5-7% of your body weight can  help reverse insulin resistance. Do not drink alcohol if: Your provider tells you not to drink. You're pregnant, may be pregnant, or plan to become pregnant. If you drink alcohol: Limit how much you have to: 0-1 drink a day if you're female. 0-2 drinks a day if you're female. Know how much alcohol is in your drink. In the [place] one drink is one 12 oz  bottle of beer (355 mL), one 5 oz glass of wine (148 mL), or one 1 oz glass of hard liquor (44 mL). General instructions Take medicines only as told. You may be given medicines that help lower the risk of type 2 diabetes. Do not smoke, vape, or use nicotine or tobacco. Where to find more information American Diabetes Association: diabetes.org/about-diabetes/prediabetes Academy of Nutrition and Dietetics: eatright.org American Heart Association: Go to ThisJobs.cz. Click the search icon. Type "prediabetes" in the search box. Contact a health care provider if: You have any of these symptoms: Increased hunger. Peeing more often than usual. Increased thirst. Feeling tired. Changes in how you see, like blurry vision. Feeling like you may throw up. Throwing up. Get help right away if: You have shortness of breath. You feel confused. This information is not intended to replace advice given to you by your health care provider. Make sure you discuss any questions you have with your health care provider. Document Revised: 05/07/2023 Document Reviewed: 05/07/2023 Elsevier Patient Education  2024 [employer].

## 2024-08-27 NOTE — Progress Notes (Signed)
 I,Kelly Charles, CMA,acting as a neurosurgeon for Kelly LOISE Slocumb, MD.,have documented all relevant documentation on the behalf of Kelly LOISE Slocumb, MD,as directed by  Kelly LOISE Slocumb, MD while in the presence of Kelly LOISE Slocumb, MD.  Subjective:  Patient ID: Kelly Charles , female    DOB: Oct 15, 1976 , 48 y.o.   MRN: [id number]  Chief Complaint  Patient presents with   Prediabetes    Patient presents today for predm & thyroid  follow up. She reports compliance with mediations. Denies headache, chest pain & sob.   Hypothyroidism    HPI Discussed the use of AI scribe software for clinical note transcription with the patient, who gave verbal consent to proceed.  History of Present Illness Kelly Charles Kelly Charles is a 48 year old female who presents for a prediabetes check.  She has been experiencing poor appetite control, describing her appetite as 'atrocious'. She feels hungry but does not want food, leading to irregular eating patterns such as snacking or not eating during the day, followed by eating from the pantry at night.  She is currently taking metformin  daily for her prediabetes management.  She is also on atenolol  25 mg daily for hypertension.  For depression, she takes Wellbutrin 300 mg daily, Buspar  15 mg three times a day, and duloxetine  60 mg twice a day. She recently experienced a mental health episode and sought to be written out of work.  She has a history of thyroid  disorder and was previously seen by Dr. Tommas. She has not returned for follow-up but plans to monitor symptoms and contact the doctor if needed.  She recently received a flu shot.   Past Medical History:  Diagnosis Date   Anxiety    Carpal tunnel syndrome on both sides    Depression    Diabetes mellitus without complication (HCC)    DM2   Hx of chest pain 08/11/2020   Pt was taken via EMS to ER. No abnormal labs, cxray, or EKG (sinus tach). Pain was determined not  to be emergent so patient left after 8 hours.She followed up with PCP on 08/12/21. No chest pain or SOB since as of 09/21/21. Pt stated that she was told the problem may be related to her thyroid . She has an endocrinology appt on 10/07/21 per pt.   Hyperthyroidism 2009   radioactive iodine   Trigeminal neuralgia      Family History  Problem Relation Age of Onset   Cancer Maternal Grandfather        BLADDER   Cancer Father        PROSTATE   Diabetes Father    Diabetes Mother    Mental illness Mother        ANXIETY   Aneurysm Mother        Small cerebral aneurysm   Thyroid  disease Maternal Aunt    Cancer Maternal Aunt        LUNG   Hypertension Maternal Uncle    Diabetes Maternal Uncle    Hypertension Maternal Aunt    Diabetes Maternal Aunt    Mental illness Maternal Aunt    Breast cancer Maternal Aunt      Current Outpatient Medications:    atenolol  (TENORMIN ) 25 MG tablet, Take 1 tablet (25 mg total) by mouth daily., Disp: 90 tablet, Rfl: 3   buPROPion (WELLBUTRIN XL) 300 MG 24 hr tablet, Take 300 mg by mouth every morning., Disp: , Rfl:    busPIRone  (BUSPAR ) 15 MG tablet,  Take 15 mg by mouth 3 (three) times daily., Disp: , Rfl:    DULoxetine  (CYMBALTA ) 60 MG capsule, One tab po bid, Disp: 180 capsule, Rfl: 0   metFORMIN  (GLUCOPHAGE ) 1000 MG tablet, TAKE 1 TABLET BY MOUTH EVERY DAY WITH BREAKFAST, Disp: 90 tablet, Rfl: 1   Vitamin D , Ergocalciferol , (DRISDOL ) 1.25 MG (50000 UNIT) CAPS capsule, TAKE 1 CAPSULE (50,000 UNITS TOTAL) BY MOUTH EVERY 7 (SEVEN) DAYS, Disp: 12 capsule, Rfl: 4   Allergies  Allergen Reactions   Levaquin [Levofloxacin] Anaphylaxis    Hives, dyspepsia, shortness of breath   Penicillins Rash     Review of Systems  Constitutional: Negative.   Respiratory: Negative.    Cardiovascular: Negative.   Gastrointestinal: Negative.   Neurological: Negative.   Psychiatric/Behavioral: Negative.       Today's Vitals   08/27/24 0955  BP: 110/80  Pulse: 84   Temp: 98.4 F (36.9 C)  SpO2: 98%  Weight: 224 lb (101.6 kg)  Height: 5' 6 (1.676 m)   Body mass index is 36.15 kg/m.  Wt Readings from Last 3 Encounters:  08/27/24 224 lb (101.6 kg)  07/17/24 220 lb (99.8 kg)  04/08/24 219 lb (99.3 kg)     Objective:  Physical Exam Vitals and nursing note reviewed.  Constitutional:      Appearance: Normal appearance.  HENT:     Head: Normocephalic and atraumatic.  Eyes:     Extraocular Movements: Extraocular movements intact.  Cardiovascular:     Rate and Rhythm: Normal rate and regular rhythm.     Heart sounds: Normal heart sounds.  Pulmonary:     Effort: Pulmonary effort is normal.     Breath sounds: Normal breath sounds.  Musculoskeletal:     Cervical back: Normal range of motion.  Skin:    General: Skin is warm.  Neurological:     General: No focal deficit present.     Mental Status: She is alert.  Psychiatric:        Mood and Affect: Mood normal.        Behavior: Behavior normal.      Assessment And Plan:  Prediabetes Assessment & Plan: Weight gain and appetite changes noted. Continued metformin  management. - Ordered A1c, liver, and kidney function tests. - Encouraged regular exercise to prevent diabetes progression. - Previous labs reviewed, her A1c has been elevated in the past. I will check an A1c today. Reminded to avoid refined sugars including sugary drinks/foods and processed meats including bacon, sausages and deli meats.    Orders: -     CMP14+EGFR -     Hemoglobin A1c  Subclinical hyperthyroidism Assessment & Plan: Chronic, not on any meds. Previous management by Dr. Balan. - Ordered thyroid  function tests.  Orders: -     TSH -     TSH + free T4  Anxiety and depression Assessment & Plan: Chronic, followed by Psychiatry, Dr. Sable.   - Managed with Wellbutrin, Buspar , and duloxetine .   General Health Maintenance Discussed routine health maintenance, including flu vaccination and cardiac  calcium score. - Will schedule cardiac calcium score. - Ensure flu vaccination is up to date. Return June 2026 - physical (check dates please).  Patient was given opportunity to ask questions. Patient verbalized understanding of the plan and was able to repeat key elements of the plan. All questions were answered to their satisfaction.   I, Kelly LOISE Slocumb, MD, have reviewed all documentation for this visit. The documentation on 08/27/24 for the exam, diagnosis,  procedures, and orders are all accurate and complete.   IF YOU HAVE BEEN REFERRED TO A SPECIALIST, IT MAY TAKE 1-2 WEEKS TO SCHEDULE/PROCESS THE REFERRAL. IF YOU HAVE NOT HEARD FROM US /SPECIALIST IN TWO WEEKS, PLEASE GIVE US  A CALL AT [phone].   THE PATIENT IS ENCOURAGED TO PRACTICE SOCIAL DISTANCING DUE TO THE COVID-19 PANDEMIC.

## 2024-08-28 LAB — TSH+FREE T4
Free T4: 1.25 ng/dL (ref 0.82–1.77)
TSH: 0.454 u[IU]/mL (ref 0.450–4.500)

## 2024-08-28 LAB — CMP14+EGFR
ALT: 7 IU/L (ref 0–32)
AST: 10 IU/L (ref 0–40)
Albumin: 4.3 g/dL (ref 3.9–4.9)
Alkaline Phosphatase: 62 IU/L (ref 41–116)
BUN/Creatinine Ratio: 11 (ref 9–23)
BUN: 10 mg/dL (ref 6–24)
Bilirubin Total: 0.5 mg/dL (ref 0.0–1.2)
CO2: 23 mmol/L (ref 20–29)
Calcium: 10.2 mg/dL (ref 8.7–10.2)
Chloride: 103 mmol/L (ref 96–106)
Creatinine, Ser: 0.87 mg/dL (ref 0.57–1.00)
Globulin, Total: 3.2 g/dL (ref 1.5–4.5)
Glucose: 87 mg/dL (ref 70–99)
Potassium: 5.4 mmol/L — ABNORMAL HIGH (ref 3.5–5.2)
Sodium: 138 mmol/L (ref 134–144)
Total Protein: 7.5 g/dL (ref 6.0–8.5)
eGFR: 83 mL/min/1.73 (ref >59)

## 2024-08-28 LAB — HEMOGLOBIN A1C
Est. average glucose Bld gHb Est-mCnc: 126 mg/dL
Hgb A1c MFr Bld: 6 % — ABNORMAL HIGH (ref 4.8–5.6)

## 2024-09-01 ENCOUNTER — Ambulatory Visit: Payer: Self-pay | Admitting: Internal Medicine

## 2024-09-01 NOTE — Assessment & Plan Note (Signed)
 Chronic, not on any meds. Previous management by Dr. Balan. - Ordered thyroid  function tests.

## 2024-09-01 NOTE — Assessment & Plan Note (Signed)
 Weight gain and appetite changes noted. Continued metformin  management. - Ordered A1c, liver, and kidney function tests. - Encouraged regular exercise to prevent diabetes progression. - Previous labs reviewed, her A1c has been elevated in the past. I will check an A1c today. Reminded to avoid refined sugars including sugary drinks/foods and processed meats including bacon, sausages and deli meats.

## 2024-09-01 NOTE — Assessment & Plan Note (Signed)
 Chronic, followed by Psychiatry, Dr. Akintayo.   - Managed with Wellbutrin, Buspar , and duloxetine .

## 2024-09-05 ENCOUNTER — Other Ambulatory Visit

## 2024-09-05 DIAGNOSIS — Z006 Encounter for examination for normal comparison and control in clinical research program: Secondary | ICD-10-CM

## 2024-09-09 ENCOUNTER — Other Ambulatory Visit: Payer: Self-pay | Admitting: Internal Medicine

## 2024-09-16 LAB — GENECONNECT MOLECULAR SCREEN: Genetic Analysis Overall Interpretation: NEGATIVE

## 2024-09-26 ENCOUNTER — Ambulatory Visit (HOSPITAL_BASED_OUTPATIENT_CLINIC_OR_DEPARTMENT_OTHER)
Admission: RE | Admit: 2024-09-26 | Discharge: 2024-09-26 | Disposition: A | Discharge status: Home / Self Care | Payer: Self-pay | Source: Ambulatory Visit | Attending: Internal Medicine | Admitting: Internal Medicine

## 2024-09-26 DIAGNOSIS — Z8249 Family history of ischemic heart disease and other diseases of the circulatory system: Secondary | ICD-10-CM

## 2024-09-27 ENCOUNTER — Other Ambulatory Visit (HOSPITAL_BASED_OUTPATIENT_CLINIC_OR_DEPARTMENT_OTHER): Payer: Self-pay

## 2024-09-28 ENCOUNTER — Ambulatory Visit: Payer: Self-pay | Admitting: Internal Medicine

## 2024-09-30 ENCOUNTER — Other Ambulatory Visit (HOSPITAL_BASED_OUTPATIENT_CLINIC_OR_DEPARTMENT_OTHER): Payer: Self-pay

## 2024-09-30 MED ORDER — METHYLPHENIDATE HCL ER 18 MG PO TB24
18.0000 mg | ORAL_TABLET | Freq: Every morning | ORAL | 0 refills | Status: AC
  Filled 2024-09-30: qty 30, 30d supply, fill #0

## 2024-10-25 ENCOUNTER — Other Ambulatory Visit (HOSPITAL_BASED_OUTPATIENT_CLINIC_OR_DEPARTMENT_OTHER): Payer: Self-pay

## 2024-10-25 MED ORDER — METHYLPHENIDATE HCL ER (OSM) 27 MG PO TBCR
27.0000 mg | EXTENDED_RELEASE_TABLET | Freq: Every morning | ORAL | 0 refills | Status: AC
  Filled 2024-10-25: qty 30, 30d supply, fill #0

## 2024-11-08 ENCOUNTER — Other Ambulatory Visit: Payer: Self-pay | Admitting: Internal Medicine

## 2025-04-10 ENCOUNTER — Encounter: Admitting: Internal Medicine
